# Patient Record
Sex: Male | Born: 1975
Health system: Southern US, Community
[De-identification: ages and names within clinical notes are randomized; demographics above are authoritative.]

## PROBLEM LIST (undated history)

## (undated) DIAGNOSIS — F410 Panic disorder [episodic paroxysmal anxiety] without agoraphobia: Secondary | ICD-10-CM

## (undated) DIAGNOSIS — J449 Chronic obstructive pulmonary disease, unspecified: Secondary | ICD-10-CM

## (undated) DIAGNOSIS — R51 Headache: Secondary | ICD-10-CM

## (undated) DIAGNOSIS — K219 Gastro-esophageal reflux disease without esophagitis: Secondary | ICD-10-CM

## (undated) DIAGNOSIS — I10 Essential (primary) hypertension: Secondary | ICD-10-CM

## (undated) HISTORY — DX: Gastro-esophageal reflux disease without esophagitis: K21.9

## (undated) HISTORY — DX: Chronic obstructive pulmonary disease, unspecified: J44.9

## (undated) HISTORY — DX: Headache: R51

## (undated) HISTORY — PX: GANGLION CYST EXCISION: SHX1691

---

## 2002-06-12 ENCOUNTER — Encounter (INDEPENDENT_AMBULATORY_CARE_PROVIDER_SITE_OTHER): Payer: Self-pay | Admitting: *Deleted

## 2002-06-12 ENCOUNTER — Ambulatory Visit (HOSPITAL_BASED_OUTPATIENT_CLINIC_OR_DEPARTMENT_OTHER): Admission: RE | Admit: 2002-06-12 | Discharge: 2002-06-12 | Payer: Self-pay | Admitting: Orthopedic Surgery

## 2002-12-20 ENCOUNTER — Emergency Department (HOSPITAL_COMMUNITY): Admission: EM | Admit: 2002-12-20 | Discharge: 2002-12-21 | Payer: Self-pay | Admitting: Emergency Medicine

## 2004-03-25 ENCOUNTER — Emergency Department (HOSPITAL_COMMUNITY): Admission: EM | Admit: 2004-03-25 | Discharge: 2004-03-25 | Payer: Self-pay | Admitting: Emergency Medicine

## 2009-02-22 ENCOUNTER — Emergency Department (HOSPITAL_COMMUNITY): Admission: EM | Admit: 2009-02-22 | Discharge: 2009-02-22 | Payer: Self-pay | Admitting: Emergency Medicine

## 2010-08-28 ENCOUNTER — Ambulatory Visit
Admission: RE | Admit: 2010-08-28 | Discharge: 2010-08-28 | Payer: Self-pay | Source: Home / Self Care | Attending: Family Medicine | Admitting: Family Medicine

## 2010-08-28 ENCOUNTER — Encounter: Payer: Self-pay | Admitting: Family Medicine

## 2010-08-28 DIAGNOSIS — J45909 Unspecified asthma, uncomplicated: Secondary | ICD-10-CM | POA: Insufficient documentation

## 2010-09-22 ENCOUNTER — Ambulatory Visit
Admission: RE | Admit: 2010-09-22 | Discharge: 2010-09-22 | Payer: Self-pay | Source: Home / Self Care | Attending: Emergency Medicine | Admitting: Emergency Medicine

## 2010-09-22 DIAGNOSIS — R519 Headache, unspecified: Secondary | ICD-10-CM | POA: Insufficient documentation

## 2010-09-22 DIAGNOSIS — R51 Headache: Secondary | ICD-10-CM | POA: Insufficient documentation

## 2010-09-22 DIAGNOSIS — K219 Gastro-esophageal reflux disease without esophagitis: Secondary | ICD-10-CM | POA: Insufficient documentation

## 2010-09-24 NOTE — Assessment & Plan Note (Signed)
Summary: shortness of breath//ph   Vital Signs:  Patient profile:   35 year old male Height:      72 inches Weight:      231 pounds BMI:     31.44 O2 Sat:      97 % on Room air Temp:     98.7 degrees F oral Pulse rate:   86 / minute BP sitting:   120 / 68  (left arm)  Vitals Entered By: Doristine Devoid CMA (August 28, 2010 11:02 AM)  O2 Flow:  Room air CC: intermittent SOB xmonths- hx of asthma as a child   History of Present Illness: 71 yo man here today to establish care.  previous MD- Parke Simmers, hasn't seen in over 8 yrs.  SOB- occurs w/ minimal exertion (walking up the stairs, extensive talking).  running w/ his kids is difficult.  sxs started a couple months ago.  was told at pre-employment CPE in 2008 that MD was worried about his ability to 'breathe out'.  not currently using any inhalers.  no CP.  pt admits to current seasonal allergies.  Preventive Screening-Counseling & Management  Alcohol-Tobacco     Smoking Status: never  Caffeine-Diet-Exercise     Does Patient Exercise: no      Sexual History:  currently monogamous.        Drug Use:  never.    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 No apparent alopecia or balding. Ears:  External ear exam shows no significant lesions  or deformities.  Otoscopic examination reveals clear canals, tympanic membranes are intact bilaterally without bulging, retraction, inflammation or discharge. Hearing is grossly normal bilaterally. Nose:  marked turbinate edema Mouth:  + PND, otherwise WNL Neck:  No deformities, masses, or tenderness noted. Lungs:  Normal respiratory effort, chest expands symmetrically. Lungs are clear to auscultation, no crackles or wheezes. Heart:  Normal rate and regular rhythm. S1 and S2 normal without gallop, murmur, click, rub or other extra sounds. Pulses:  +2 carotid, radial, DP   Impression & Recommendations:  Problem # 1:  ASTHMA (ICD-493.90) Assessment New  pt's sxs suspicious for asthma.  pt did PFTs- pre and post neb tx.  the pre-albuterol PFTs caused pt to get light headed.  pt felt the post albuterol PFTs were easier to complete.  lung age on pre-neb PFTs is >6 yo.  reviewed this w/ pt.  will start Qvar as controller med and albuterol as needed for SOB.  will refer for complete pulm evaluation and tx.  total time spent w/ pt, testing, and treating 43 minutes. His updated medication list for this problem includes:    Qvar 40 Mcg/act Aers (Beclomethasone dipropionate) .Marland Kitchen... 2 puffs two times a day to control symptoms    Proair Hfa 108 (90 Base) Mcg/act Aers (Albuterol sulfate) .Marland Kitchen... 2 puffs q4 as needed for wheezing or shortness of breath  Orders: Nebulizer Tx ([address])  Spirometry (Pre & Post) [phone]) HFA Instruction [phone]) Pulmonary Referral (Pulmonary)  Complete Medication List: 1)  Nasonex 50 Mcg/act Susp (Mometasone furoate) .... 2 sprays each nostril once daily 2)  Qvar 40 Mcg/act Aers (Beclomethasone dipropionate) .... 2 puffs two times a day to control symptoms 3)  Proair Hfa 108 (90 Base) Mcg/act Aers (Albuterol sulfate) .... 2 puffs q4 as needed for wheezing or shortness of breath  Patient Instructions: 1)  Please schedule a follow-up appointment in 6 weeks to follow up on your  asthma 2)  Start the Nasonex daily to decrease congestion and post nasal drip 3)  Use the Qvar EVERY DAY- this is your controller medicine 4)  Use the Proair AS NEEDED- this is the rescue medicine 5)  We'll call you with your pulmonary appt 6)  Call with any questions or concerns 7)  Welcome!  We're glad to have you!  Prescriptions: PROAIR HFA 108 (90 BASE) MCG/ACT AERS (ALBUTEROL SULFATE) 2 puffs Q4 as needed for wheezing or shortness of breath  #1 x 3   Entered and Authorized by:   Neena Rhymes MD   Signed by:   Neena Rhymes MD on 08/28/2010   Method used:   [facility] to        [facility] [id number]       [address]       [place], [place]  [address]       Ph: [phone] or [phone]       Fax: [phone]   RxID:   [id number] QVAR 40 MCG/ACT AERS (BECLOMETHASONE DIPROPIONATE) 2 puffs two times a day to control symptoms  #1 x 3   Entered and Authorized by:   Neena Rhymes MD   Signed by:   Neena Rhymes MD on 08/28/2010   Method used:   [facility]        [id number]       [address]       [place], [place]  [address]       Ph: [phone] or [phone]       Fax: [phone]   RxID:   [id number] NASONEX 50 MCG/ACT SUSP (MOMETASONE FUROATE) 2 sprays each nostril once daily  #1 x 3   Entered and Authorized by:   Neena Rhymes MD   Signed by:   Neena Rhymes MD on 08/28/2010   Method used:   [facility]        [facility] [id number]       [address]       [place], [place]  [address]       Ph: [phone] or [phone]       Fax: [phone]   RxID:   [id number]    Orders Added: 1)  New Patient Level III [99203] 2)  Nebulizer Tx [27782] 3)  Spirometry (Pre & Post) [94060] 4)  HFA Instruction [42353] 5)  Pulmonary Referral [Pulmonary]

## 2010-09-30 NOTE — Assessment & Plan Note (Signed)
Summary: asthma, GERD   Visit Type:  Initial Consult Copy to:  Dr. Neena Rhymes Primary Provider/Referring Provider:  Dr. Neena Rhymes  CC:  Pulmonary consult.  ? exercise induced asthma..  History of Present Illness: 35 yo m, smoker, hx GERD, childhood asthma as a small child, but did not require BD' s in grade school or beyond. 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   Noticed some cough and residual SOB after exercise. Now with SOB with climbing stairs, playing with his children. Has GERD symptoms more bothersome for several months, sour reflux into mouth. Hears wheezing with exertion. Using ProAir as needed, 2 -3 x a day. Started QVAR 1/12 by Dr Beverely Low.   Preventive Screening-Counseling & Management  Alcohol-Tobacco     Alcohol drinks/day: 2     Alcohol type: beer     Smoking Status: quit     Packs/Day: 1.0     Year Started: 1994     Year Quit: 2002     Pack years: 8  Current Medications (verified): 1)  Proair Hfa 108 (90 Base) Mcg/act Aers (Albuterol Sulfate) .... 2 Puffs Q4 As Needed For Wheezing or Shortness of Breath 2)  Alka-Seltzer Extra Strength 500-1-1.985 Mg-Gm-Gm Tbef (Aspirin Effervescent) .... Once Daily  Allergies (verified): No Known Drug Allergies  Past History:  Past Medical History: Current Problems:  G E R D (ICD-530.81) HEADACHE, CHRONIC (ICD-784.0) ASTHMA (ICD-493.90)  Past Surgical History: L cyst removal from wrist in 2004  Family History: CAD-no HTN-mother,father DM-father, PGF STROKE-no COLON CA-no PROSTATE CA-no  Social History: Alcohol drinks/day:  2 Smoking Status:  quit Packs/Day:  1.0 Pack years:  8  Review of Systems       The patient complains of shortness of breath with activity, shortness of breath at rest, non-productive cough, acid heartburn, indigestion, weight change, and anxiety.  The patient denies productive cough, coughing up blood, chest pain, irregular heartbeats, loss of  appetite, abdominal pain, difficulty swallowing, sore throat, tooth/dental problems, headaches, nasal congestion/difficulty breathing through nose, sneezing, itching, ear ache, depression, hand/feet swelling, joint stiffness or pain, rash, change in color of mucus, and fever.    Vital Signs:  Patient profile:   35 year old male Height:      72 inches (182.88 cm) Weight:      243.13 pounds (110.51 kg) BMI:     33.09 O2 Sat:      98 % on Room air Temp:     98.1 degrees F (36.72 degrees C) oral Pulse rate:   65 / minute BP sitting:   136 / 90  (right arm) Cuff size:   large  Vitals Entered By: Michel Bickers CMA (September 22, 2010 11:24 AM)  O2 Sat at Rest %:  98 O2 Flow:  Room air CC: Pulmonary consult.  ? exercise induced asthma. Is Patient Diabetic? No Comments Medications reviewed with patient Michel Bickers CMA  September 22, 2010 11:39 AM   Physical Exam  General:  Well-developed,well-nourished,in no acute distress; alert,appropriate and cooperative throughout examination Head:  normocephalic and atraumatic Eyes:  conjunctiva and sclera clear Nose:  no deformity, discharge, inflammation, or lesions Mouth:  no deformity or lesions Neck:  No deformities, masses, or tenderness noted. Lungs:  Normal respiratory effort, chest expands symmetrically. Lungs are clear to auscultation, no crackles or wheezes. Heart:  Normal rate and regular rhythm. S1 and S2 normal without gallop, murmur, click, rub or other extra sounds. Extremities:  no clubbing, cyanosis, edema, or  deformity noted Psych:  alert and cooperative; normal mood and affect; normal attention span and concentration   Pulmonary Function Test Date: 08/28/2010 Height (in.): 72 Gender: Male  Pre-Spirometry FVC    Value: 3.68 L/min   Pred: 4.74 L/min     % Pred: 78 % FEV1    Value: 1.81 L     Pred: 3.85 L     % Pred: 47 % FEV1/FVC  Value: 49 %     Pred: 81 %     % Pred: 60 % FEF 25-75  Value: 0.82 L/min   Pred: 4.53 L/min     %  Pred: 18 %  Post-Spirometry FVC    Value: 4.52 L/min   Pred: 4.74 L/min     % Pred: 95 % FEV1    Value: 2.11 L     Pred: 3.85 L     % Pred: 55 % FEV1/FVC  Value: 47 %     Pred: 81 %     % Pred: 57 % FEF 25-75  Value: 0.88 L/min   Pred: 4.53 L/min     % Pred: 19 %  Comments: Moderately severe AFL with positive BD response. RSB  Impression & Recommendations:  Problem # 1:  ASTHMA (ICD-493.90) Dx confirmed by spiro 1/6 by Dr Beverely Low. Control really no better on the QVAR, still using freq SABA. May be due to uncontrolled GERD.  - will try to control GERD, see if this allows better control on QVAR + albuterol. If so, will continue this regimen. If not, then may need to change to LABA/ICS combo - if control is excellent after GERD control, could consider peeling of the QVAR at some point.  - agree with Dr Beverely Low, he may need scheduled meds for allergies, esp during spring/fall  Problem # 2:  G E R D (ICD-530.81) - start omeprazole once daily - discussed GERD diet and not eating before bedtime, etc  Medications Added to Medication List This Visit: 1)  Alka-seltzer Extra Strength 500-1-1.985 Mg-gm-gm Tbef (Aspirin effervescent) .... Once daily 2)  Omeprazole 20 Mg Cpdr (Omeprazole) .Marland Kitchen.. 1 by mouth once daily  Other Orders: Consultation Level IV (04540)  Patient Instructions: 1)  Please continue your QVAR , 2 puffs two times a day  2)  Use ProAir as needed  3)  Start omeprazole 20mg  by mouth once daily  4)  Try to avoid meals within 2 hours of bedtime.  5)  Follow up with Dr Delton Coombes in 1 month to assess your progress.  Prescriptions: OMEPRAZOLE 20 MG CPDR (OMEPRAZOLE) 1 by mouth once daily  #30 x 3   Entered and Authorized by:   Leslye Peer MD   Signed by:   Leslye Peer MD on 09/22/2010   Method used:   [facility] [id number]       [address]       [place], [place]  [address]       Ph: [phone] or [phone]        Fax: [phone]   RxID:   [id number]

## 2010-10-03 ENCOUNTER — Encounter: Payer: Self-pay | Admitting: Family Medicine

## 2010-10-09 ENCOUNTER — Ambulatory Visit: Payer: Self-pay | Admitting: Family Medicine

## 2010-10-12 ENCOUNTER — Ambulatory Visit: Payer: Self-pay | Admitting: Family Medicine

## 2010-10-14 ENCOUNTER — Encounter: Payer: Self-pay | Admitting: Family Medicine

## 2010-10-14 ENCOUNTER — Ambulatory Visit (INDEPENDENT_AMBULATORY_CARE_PROVIDER_SITE_OTHER): Payer: BC Managed Care – PPO | Admitting: Family Medicine

## 2010-10-14 DIAGNOSIS — J45909 Unspecified asthma, uncomplicated: Secondary | ICD-10-CM

## 2010-10-14 DIAGNOSIS — J309 Allergic rhinitis, unspecified: Secondary | ICD-10-CM

## 2010-10-14 DIAGNOSIS — J069 Acute upper respiratory infection, unspecified: Secondary | ICD-10-CM | POA: Insufficient documentation

## 2010-10-20 NOTE — Assessment & Plan Note (Signed)
Summary: 6 WKS ROV/PH--resch from Fri 2/17, pt resch/nta   Vital Signs:  Patient profile:   35 year old male Weight:      233 pounds BMI:     31.71 Pulse rate:   70 / minute BP sitting:   120 / 80  (left arm)  Vitals Entered By: Doristine Devoid CMA (October 14, 2010 4:20 PM) CC: f/u on asthma   History of Present Illness: 35 yo man here today for asthma f/u.  has started taking Omeprazole for GERD, continuing the QVAR.  using Albuterol inhaler as needed.  was doing well until recent URI.  again having SOB.  has lost 10 lbs.  overall feeling well.  had got to the point of rarely using recue inhaler.  kids recently sick, pt developed dry cough, chills, sxs started 5 days ago.  denies ear pain, fever, sore throat.  used nasonex samples- has run out of meds.  Current Medications (verified): 1)  Proair Hfa 108 (90 Base) Mcg/act Aers (Albuterol Sulfate) .... 2 Puffs Q4 As Needed For Wheezing or Shortness of Breath 2)  Alka-Seltzer Extra Strength 500-1-1.985 Mg-Gm-Gm Tbef (Aspirin Effervescent) .... Once Daily 3)  Omeprazole 20 Mg Cpdr (Omeprazole) .Marland Kitchen.. 1 By Mouth Once Daily 4)  Qvar 40 Mcg/act Aers (Beclomethasone Dipropionate) .... Take 2 Puffs Two Times A Day 5)  Nasonex 50 Mcg/act Susp (Mometasone Furoate) .... 2 Sprays Each Nostril Once Daily  Allergies (verified): No Known Drug Allergies  Past History:  Past medical, surgical, family and social histories (including risk factors) reviewed, and no changes noted (except as noted below).  Past Medical History: Current Problems:  G E R D (ICD-530.81) HEADACHE, CHRONIC (ICD-784.0) ASTHMA (ICD-493.90) rhinitis  Past Surgical History: Reviewed history from 09/22/2010 and no changes required. L cyst removal from wrist in 2004  Family History: Reviewed history from 09/22/2010 and no changes required. CAD-no HTN-mother,father DM-father, PGF STROKE-no COLON CA-no PROSTATE CA-no  Social History: Reviewed history from 08/28/2010  and no changes required. 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 No apparent alopecia or balding.  no TTP over sinuses Eyes:  no injxn or inflammation Ears:  External ear exam shows no significant lesions or deformities.  Otoscopic examination reveals clear canals, tympanic membranes are intact bilaterally without bulging, retraction, inflammation or discharge. Hearing is grossly normal bilaterally. Nose:  marked turbinate edema Mouth:  + PND, otherwise WNL Neck:  No deformities, masses, or tenderness noted. Lungs:  Normal respiratory effort, chest expands symmetrically. Lungs are clear to auscultation, no crackles or wheezes. Heart:  Normal rate and regular rhythm. S1 and S2 normal without gallop, murmur, click, rub or other extra sounds.   Impression & Recommendations:  Problem # 1:  ASTHMA (ICD-493.90) Assessment Unchanged improved since starting meds.  had almost stopped albuterol use completely. His updated medication list for this problem includes:    Proair Hfa 108 (90 Base) Mcg/act Aers (Albuterol sulfate) .Marland Kitchen... 2 puffs q4 as needed for wheezing or shortness of breath    Qvar 40 Mcg/act Aers (Beclomethasone dipropionate) .Marland Kitchen... Take 2 puffs two times a day  Problem # 2:  RHINITIS (ICD-477.9) Assessment: New script given for nasonex as pt feels this was helpful.  add OTC antihistamine His updated medication list for this problem includes:  Nasonex 50 Mcg/act Susp (Mometasone furoate) .Marland Kitchen... 2 sprays each nostril once daily  Problem # 3:  URI (ICD-465.9) Assessment: New sxs appear viral.  no evidence of bacterial infxn on exam.  reviewed supportive care and red flags that should prompt return.  Pt expresses  understanding and is in agreement w/ this plan. His updated medication list for this problem includes:    Alka-seltzer Extra Strength 500-1-1.985 Mg-gm-gm Tbef (Aspirin effervescent) ..... Once daily  Complete Medication List: 1)  Proair Hfa 108 (90 Base) Mcg/act Aers (Albuterol sulfate) .... 2 puffs q4 as needed for wheezing or shortness of breath 2)  Alka-seltzer Extra Strength 500-1-1.985 Mg-gm-gm Tbef (Aspirin effervescent) .... Once daily 3)  Omeprazole 20 Mg Cpdr (Omeprazole) .Marland Kitchen.. 1 by mouth once daily 4)  Qvar 40 Mcg/act Aers (Beclomethasone dipropionate) .... Take 2 puffs two times a day 5)  Nasonex 50 Mcg/act Susp (Mometasone furoate) .... 2 sprays each nostril once daily  Patient Instructions: 1)  Add Claritin or Zyrtec daily for allergy component 2)  Continue the Nasonex for nasal allergies and post nasal drip 3)  Ibuprofen for body aches and/or fever 4)  Continue the Qvar, Omeprazole as directed 5)  Hang in there!!! Prescriptions: QVAR 40 MCG/ACT AERS (BECLOMETHASONE DIPROPIONATE) take 2 puffs two times a day  #1 x 3   Entered and Authorized by:   Neena Rhymes MD   Signed by:   Neena Rhymes MD on 10/14/2010   Method used:   [facility] to        [id number]       [address]       [place], [place]  [address]       Ph: [phone] or [phone]       Fax: [phone]   RxID:   [id number] PROAIR HFA 108 (90 BASE) MCG/ACT AERS (ALBUTEROL SULFATE) 2 puffs Q4 as needed for wheezing or shortness of breath  #1 x 3   Entered and Authorized by:   Neena Rhymes MD   Signed by:   Neena Rhymes MD on 10/14/2010   Method used:   [facility]        [facility]  [id number]       [address]       [place], [place]  [address]       Ph: [phone] or [phone]       Fax: [phone]   RxID:   [id number] NASONEX 50 MCG/ACT SUSP (MOMETASONE FUROATE) 2 sprays each nostril  once daily  #1 x 3   Entered and Authorized by:   Neena Rhymes MD   Signed by:   Neena Rhymes MD on 10/14/2010   Method used:   [facility]        [facility]  [address] [id number]       [address]       [place], [place]  [address]       Ph: [phone] or [phone]       Fax: [phone]   RxID:   [id number]    Orders Added: 1)  Est. Patient Level IV [09735]

## 2010-10-21 ENCOUNTER — Ambulatory Visit: Payer: Self-pay | Admitting: Emergency Medicine

## 2010-10-22 ENCOUNTER — Telehealth: Payer: Self-pay | Admitting: Emergency Medicine

## 2010-10-29 NOTE — Progress Notes (Signed)
Summary: nos appt  Phone Note Call from Patient   Caller: [email or web address]  Call For: Michaelina Blandino Summary of Call: LMTCB x2 to rsc nos from 2/29. Initial call taken by: Darletta Moll,  October 22, 2010 3:17 PM

## 2011-01-08 NOTE — Op Note (Signed)
NAME:  Richard Hanna, Richard Hanna                          ACCOUNT NO.:  [id number]   MEDICAL RECORD NO.:  [id number]                   PATIENT TYPE:  AMB   LOCATION:  [facility]                                  FACILITY:  [facility]   PHYSICIAN:  Katy Fitch. Naaman Plummer., M.D.          DATE OF BIRTH:  05-Oct-1975   DATE OF PROCEDURE:  06/12/2002  DATE OF DISCHARGE:                                 OPERATIVE REPORT   PREOPERATIVE DIAGNOSIS:  Enlarging mass, dorsal aspect of the left wrist,  consistent with dorsal capsular ganglion.   POSTOPERATIVE DIAGNOSIS:  Enlarging mass, dorsal aspect of the left wrist,  consistent with dorsal capsular ganglion.   OPERATION:  Excision of a complex dorsal capsular ganglion, left wrist.   SURGEON:  Katy Fitch. Sypher, M.D.   ASSISTANT:  Marveen Reeks. Dasnoit, P. A.   ANESTHESIA:  General by LMA.   SUPERVISING ANESTHESIOLOGIST:  Janetta Hora. Gelene Mink, M.D.   INDICATIONS FOR SURGERY:  The patient is a 35 year old who presented for  evaluation and management of an uncomfortable mass on the dorsal aspect of  his left wrist.  Clinical examination suggested an enlarging ganglion.  Plain films did not document an interosseous component.  We recommended  proceeding with an excisional biopsy under regional anesthesia with  exploration of scapholunate interosseous ligament.   DESCRIPTION OF OPERATION:  The patient was brought to the operating room and  placed in the supine position on the operating table.  Following induction  of general anesthesia by LMA the left arm was prepped with Betadine soap and  solution, and sterilely draped.  Following exsanguination of the limb with  an Esmarch bandage an arterial tourniquet on the proximal brachium was  inflated to 220 mmHg.  Procedure commenced with a transverse incision  directly over the mass.  Subcutaneous tissues were carefully divided  revealing a large ganglion type lesion expanding the extensor retinaculum.  The proximal fibers of  the retinaculum were dissected off the mass and a few  filmy fibers of the retinaculum were taken with the mass distally.  The mass  was circumferentially dissected and involved a mucinous myxoma type  expansion of the dorsal capsule.  This was directly involving the dorsal  carpal arch.  The transverse dorsal carpal artery was dissected off the mass  and several feeding vessels were intimately involved with the mass, and were  resected with it.  Electrocautery was used to control the small arterial  bleeders.  Care was taken to preserve the posterior interosseous nerve.  The  cyst was circumferentially dissected down to the wrist capsule.  It appeared  to be exiting just distal to the scapholunate interosseous ligament.  A  radiocarpal arthrotomy was accomplished revealing an intact scapholunate  interosseous ligament.  The mid carpal joint did not appear to be involved.  The dorsal surface of the scapholunate ligament was cleaned with a rongeur.  Bleeding points were electrocauterized with  bipolar current followed by  repair of the capsule with a simple suture of 4-0 Vicryl followed by repair  of the skin with intradermal 3-0 Prolene.   There were no apparent complications.   The patient tolerated the surgery and anesthesia well.  He was transferred  to the recovery room with stable vital signs.                                                 Katy Fitch Naaman Plummer., M.D.    RVS/MEDQ  D:  06/12/2002  T:  06/12/2002  Job:  [id number]

## 2011-10-28 ENCOUNTER — Encounter: Payer: Self-pay | Admitting: Emergency Medicine

## 2011-10-28 ENCOUNTER — Ambulatory Visit (INDEPENDENT_AMBULATORY_CARE_PROVIDER_SITE_OTHER): Payer: BC Managed Care – PPO | Admitting: Emergency Medicine

## 2011-10-28 VITALS — BP 120/74 | HR 75 | Temp 98.1°F | Ht 70.0 in | Wt 220.0 lb

## 2011-10-28 DIAGNOSIS — J45909 Unspecified asthma, uncomplicated: Secondary | ICD-10-CM

## 2011-10-28 NOTE — Assessment & Plan Note (Signed)
Not clear whether this is his asthma or not - some of the sx are inconsistent with his prior episodes. Could reflect some deconditioning. Will try to treat asthma and see if he improves; if no real change then I would favor restarting exercise regimen (with SABA available in case of exercise-related sx).  - restart QVAR 80, 1 puff bid + Ventolin prn.  - rov in 6 weeks to reassess

## 2011-10-28 NOTE — Progress Notes (Signed)
36 yo m, smoker, hx GERD, childhood asthma as a small child, but did not require BD' s in grade school or beyond. 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   Noticed some cough and residual SOB after exercise. Now with SOB with climbing stairs, playing with his children. Has GERD symptoms more bothersome for several months, sour reflux into mouth. Hears wheezing with exertion. Using ProAir as needed, 2 -3 x a day. Started QVAR 1/12 by Dr Beverely Low.   ROV 10/28/11 -- Asthma with moderate control, felt to be made difficult by GERD. We treated with omeprazole, used for a few weeks but not since. GERD sx seem to be better. Stopped QVAR about a year ago and rarely needed SABA until last few months. He coughs with strenuous work. Now having episodes of DOE, sometimes at rest. Hasn't tried SABA yet with these episode   Gen: Pleasant, well-nourished, in no distress,  normal affect  ENT: No lesions,  mouth clear,  oropharynx clear, no postnasal drip  Neck: No JVD, no TMG, no carotid bruits  Lungs: No use of accessory muscles, no dullness to percussion, clear without rales or rhonchi  Cardiovascular: RRR, heart sounds normal, no murmur or gallops, no peripheral edema  Musculoskeletal: No deformities, no cyanosis or clubbing  Neuro: alert, non focal  Skin: Warm, no lesions or rashes  ASTHMA Not clear whether this is his asthma or not - some of the sx are inconsistent with his prior episodes. Could reflect some deconditioning. Will try to treat asthma and see if he improves; if no real change then I would favor restarting exercise regimen (with SABA available in case of exercise-related sx).  - restart QVAR 80, 1 puff bid + Ventolin prn.  - rov in 6 weeks to reassess

## 2011-10-28 NOTE — Patient Instructions (Signed)
Please restart OVAR , 1 puff twice a day Use Ventolin 2 puffs if needed for shortness of breath Keep track of your symptoms and follow up with Dr Delton Coombes in 6 weeks to review your status

## 2011-11-01 ENCOUNTER — Ambulatory Visit: Payer: BC Managed Care – PPO | Admitting: Emergency Medicine

## 2011-12-13 ENCOUNTER — Ambulatory Visit: Payer: BC Managed Care – PPO | Admitting: Emergency Medicine

## 2012-01-04 ENCOUNTER — Ambulatory Visit (INDEPENDENT_AMBULATORY_CARE_PROVIDER_SITE_OTHER): Payer: BC Managed Care – PPO | Admitting: Emergency Medicine

## 2012-01-04 ENCOUNTER — Encounter: Payer: Self-pay | Admitting: Emergency Medicine

## 2012-01-04 VITALS — BP 116/70 | HR 79 | Temp 98.3°F | Ht 70.0 in | Wt 219.0 lb

## 2012-01-04 DIAGNOSIS — J309 Allergic rhinitis, unspecified: Secondary | ICD-10-CM

## 2012-01-04 DIAGNOSIS — J45909 Unspecified asthma, uncomplicated: Secondary | ICD-10-CM

## 2012-01-04 MED ORDER — LORATADINE 10 MG PO TABS: 10.0000 mg | ORAL_TABLET | Freq: Every day | ORAL | Status: DC

## 2012-01-04 NOTE — Progress Notes (Signed)
36 yo m, smoker, hx GERD, childhood asthma as a small child, but did not require BD' s in grade school or beyond. 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   Noticed some cough and residual SOB after exercise. Now with SOB with climbing stairs, playing with his children. Has GERD symptoms more bothersome for several months, sour reflux into mouth. Hears wheezing with exertion. Using ProAir as needed, 2 -3 x a day. Started QVAR 1/12 by Dr Beverely Low.   ROV 10/28/11 -- Asthma with moderate control, felt to be made difficult by GERD. We treated with omeprazole, used for a few weeks but not since. GERD sx seem to be better. Stopped QVAR about a year ago and rarely needed SABA until last few months. He coughs with strenuous work. Now having episodes of DOE, sometimes at rest. Hasn't tried SABA yet with these episode   ROV 01/04/12 -- 35 yo man, hx asthma, was experiencing DOE, somewhat different from prior asthma sx. We restarted QVAR to se if it would have an effect. Hasn't restarted omeprazole, no GERD sx. His cough is better than last visit. He hasn't had to use SABA since going back on the QVAR.    Filed Vitals:   01/04/12 1634  BP: 116/70  Pulse: 79  Temp: 98.3 F (36.8 C)   Gen: Pleasant, well-nourished, in no distress,  normal affect  ENT: No lesions,  mouth clear,  oropharynx clear, no postnasal drip  Neck: No JVD, no TMG, no carotid bruits  Lungs: No use of accessory muscles, no dullness to percussion, clear without rales or rhonchi  Cardiovascular: RRR, heart sounds normal, no murmur or gallops, no peripheral edema  Musculoskeletal: No deformities, no cyanosis or clubbing  Neuro: alert, non focal  Skin: Warm, no lesions or rashes  ASTHMA Seems to be benefiting from the QVAR, could reconsider stopping at some point in the future, especially if we get the allergies under control - continue QVAR - SABA prn - rov 6 mon  RHINITIS - start loratadine qd

## 2012-01-04 NOTE — Assessment & Plan Note (Signed)
-   start loratadine qd 

## 2012-01-04 NOTE — Assessment & Plan Note (Addendum)
Seems to be benefiting from the QVAR, could reconsider stopping at some point in the future, especially if we get the allergies under control - continue QVAR - SABA prn - rov 6 mon

## 2012-01-04 NOTE — Patient Instructions (Signed)
Please continue your QVAR twice a day Use your Ventolin as needed Start taking loratadine daily for your allergies Follow with Dr Delton Coombes in 6 months or sooner if you have any problems

## 2012-06-12 ENCOUNTER — Ambulatory Visit (INDEPENDENT_AMBULATORY_CARE_PROVIDER_SITE_OTHER)
Admission: RE | Admit: 2012-06-12 | Discharge: 2012-06-12 | Disposition: A | Discharge status: Home / Self Care | Payer: BC Managed Care – PPO | Source: Ambulatory Visit | Attending: Emergency Medicine | Admitting: Emergency Medicine

## 2012-06-12 ENCOUNTER — Ambulatory Visit (INDEPENDENT_AMBULATORY_CARE_PROVIDER_SITE_OTHER): Payer: BC Managed Care – PPO | Admitting: Emergency Medicine

## 2012-06-12 ENCOUNTER — Encounter: Payer: Self-pay | Admitting: Emergency Medicine

## 2012-06-12 VITALS — BP 122/80 | HR 68 | Temp 98.7°F | Ht 70.0 in | Wt 213.8 lb

## 2012-06-12 DIAGNOSIS — J45909 Unspecified asthma, uncomplicated: Secondary | ICD-10-CM

## 2012-06-12 NOTE — Assessment & Plan Note (Signed)
Unclear to me that his sx relate to asthma. Trigger seems to be exercise.  - albuterol prn - do not restart QVAR for now - CXR  - EIB + full PFT - rov 1 month

## 2012-06-12 NOTE — Progress Notes (Signed)
36 yo m, smoker, hx GERD, childhood asthma as a small child, but did not require BD' s in grade school or beyond. 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   Noticed some cough and residual SOB after exercise. Now with SOB with climbing stairs, playing with his children. Has GERD symptoms more bothersome for several months, sour reflux into mouth. Hears wheezing with exertion. Using ProAir as needed, 2 -3 x a day. Started QVAR 1/12 by Dr Beverely Low.   ROV 10/28/11 -- Asthma with moderate control, felt to be made difficult by GERD. We treated with omeprazole, used for a few weeks but not since. GERD sx seem to be better. Stopped QVAR about a year ago and rarely needed SABA until last few months. He coughs with strenuous work. Now having episodes of DOE, sometimes at rest. Hasn't tried SABA yet with these episode   ROV 01/04/12 -- hx asthma, was experiencing DOE, somewhat different from prior asthma sx. We restarted QVAR to see if it would have an effect. Hasn't restarted omeprazole, no GERD sx. His cough is better than last visit. He hasn't had to use SABA since going back on the QVAR.   ROV 06/12/12 -- asthma, moderate control. We had continued QVAR - he ran out about 2 months ago. Having some new URI sx for the last 4-5 days, has made him more SOB with exertion. Stopped QVAR in June - wasn't sure he missed it. Ran out of albuterol -    Filed Vitals:   06/12/12 1514  BP: 122/80  Pulse: 68  Temp: 98.7 F (37.1 C)   Gen: Pleasant, well-nourished, in no distress,  normal affect  ENT: No lesions,  mouth clear,  oropharynx clear, no postnasal drip  Neck: No JVD, no TMG, no carotid bruits  Lungs: No use of accessory muscles, no dullness to percussion, clear without rales or rhonchi  Cardiovascular: RRR, heart sounds normal, no murmur or gallops, no peripheral edema  Musculoskeletal: No deformities, no cyanosis or clubbing  Neuro: alert, non focal  Skin: Warm, no lesions or  rashes  ASTHMA Unclear to me that his sx relate to asthma. Trigger seems to be exercise.  - albuterol prn - do not restart QVAR for now - CXR  - EIB + full PFT - rov 1 month

## 2012-06-12 NOTE — Patient Instructions (Addendum)
CXR today Do not restart QVAR for now Use your albuterol 2 puffs as needed We will perform full pulmonary function testing We will perform an Exercise Induced Bronchospasm test (excercise spirometry) Follow with Dr Delton Coombes in 1 month

## 2012-06-14 NOTE — Progress Notes (Signed)
Quick Note:  ATC patient to inform of results, no answer. LMOMTCB ______

## 2012-06-27 ENCOUNTER — Ambulatory Visit (HOSPITAL_COMMUNITY): Payer: BC Managed Care – PPO

## 2012-07-10 ENCOUNTER — Ambulatory Visit (HOSPITAL_COMMUNITY): Payer: BC Managed Care – PPO | Attending: Emergency Medicine

## 2012-07-13 ENCOUNTER — Ambulatory Visit: Payer: BC Managed Care – PPO | Admitting: Emergency Medicine

## 2012-09-26 ENCOUNTER — Encounter: Payer: Self-pay | Admitting: Emergency Medicine

## 2012-09-26 ENCOUNTER — Ambulatory Visit (INDEPENDENT_AMBULATORY_CARE_PROVIDER_SITE_OTHER): Payer: BC Managed Care – PPO | Admitting: Emergency Medicine

## 2012-09-26 VITALS — BP 132/73 | HR 82 | Temp 98.0°F | Resp 16 | Ht 70.0 in | Wt 214.0 lb

## 2012-09-26 DIAGNOSIS — R079 Chest pain, unspecified: Secondary | ICD-10-CM

## 2012-09-26 DIAGNOSIS — F411 Generalized anxiety disorder: Secondary | ICD-10-CM

## 2012-09-26 DIAGNOSIS — F410 Panic disorder [episodic paroxysmal anxiety] without agoraphobia: Secondary | ICD-10-CM

## 2012-09-26 MED ORDER — PAROXETINE HCL 20 MG PO TABS: 20.0000 mg | ORAL_TABLET | ORAL | Status: DC

## 2012-09-26 MED ORDER — ALBUTEROL SULFATE HFA 108 (90 BASE) MCG/ACT IN AERS: 2.0000 | INHALATION_SPRAY | RESPIRATORY_TRACT | Status: DC | PRN

## 2012-09-26 MED ORDER — LORAZEPAM 1 MG PO TABS: 1.0000 mg | ORAL_TABLET | Freq: Three times a day (TID) | ORAL | Status: DC | PRN

## 2012-09-26 NOTE — Patient Instructions (Addendum)

## 2012-09-26 NOTE — Progress Notes (Signed)
[facility] [address] [place] [address] [phone]  Date:  09/26/2012   Name:  Richard Hanna   DOB:  10/14/1975   MRN:  [id number]  PCP:  Neena Rhymes, MD    Chief Complaint: Chest Pain and Medication Refill   History of Present Illness:  Richard Hanna is a 37 y.o. very pleasant male patient who presents with the following:  Has frequent recent episodes of sensation of chest pain and shortness of breath.  Not associated with asthma or wheezing.  Sometimes associated with tingling in hands and around mouth.  Today had episode of shortness of breath associated with numbness in fingers in left hand and chest discomfort that lasted about a half hour.  No associated palpitations or tachyarrhythmia or nausea or diaphoresis.  Symptoms largely now resolved.  Admits to frequent anxiety and trouble feeling like things are closing in on him.  Patient Active Problem List  Diagnosis  . ASTHMA  . G E R D  . HEADACHE, CHRONIC  . URI  . RHINITIS    Past Medical History  Diagnosis Date  . GERD (gastroesophageal reflux disease)   . Asthma   . Headache     Past Surgical History  Procedure Date  . Ganglion cyst excision     L wrist    History  Substance Use Topics  . Smoking status: Former Smoker -- 1.0 packs/day for 6 years    Types: Cigarettes    Quit date: 08/23/2002  . Smokeless tobacco: Never Used  . Alcohol Use: Yes    Family History  Problem Relation Age of Onset  . Hypertension Mother   . Hypertension Father   . Diabetes Father   . Diabetes Paternal Grandfather     No Known Allergies  Medication list has been reviewed and updated.  Current Outpatient Prescriptions on File Prior to Visit  Medication Sig Dispense Refill  . albuterol (PROAIR HFA) 108 (90 BASE) MCG/ACT inhaler Inhale 2 puffs into the lungs every 4 (four) hours as needed. For wheezing or shortness of breath       . beclomethasone (QVAR) 80 MCG/ACT inhaler Inhale 1  puff into the lungs 2 (two) times daily.      Marland Kitchen loratadine (CLARITIN) 10 MG tablet Take 1 tablet (10 mg total) by mouth daily.  30 tablet  11  . Omeprazole 20 MG TBEC Take by mouth. Take one tablet daily as needed      . PARoxetine (PAXIL) 20 MG tablet Take 1 tablet (20 mg total) by mouth every morning.  30 tablet  5    Review of Systems:  As per HPI, otherwise negative.    Physical Examination: Filed Vitals:   09/26/12 1523  BP: 132/73  Pulse: 82  Temp: 98 F (36.7 C)  Resp: 16   Filed Vitals:   09/26/12 1523  Height: 5\' 10"  (1.778 m)  Weight: 214 lb (97.07 kg)   Body mass index is 30.71 kg/(m^2). Ideal Body Weight: Weight in (lb) to have BMI = 25: 173.9   GEN: WDWN, NAD, Non-toxic, A & O x 3 HEENT: Atraumatic, Normocephalic. Neck supple. No masses, No LAD. Ears and Nose: No external deformity. CV: RRR, No M/G/R. No JVD. No thrill. No extra heart sounds. PULM: CTA B, no wheezes, crackles, rhonchi. No retractions. No resp. distress. No accessory muscle use. ABD: S, NT, ND, +BS. No rebound. No HSM. EXTR: No c/c/e NEURO Normal gait.  PSYCH: Normally interactive.  Conversant. Not depressed or anxious appearing.  Calm demeanor.    Assessment and Plan: Anxiety disorder Chest pain Ativan paxil Follow up in one month Offered ER evaluation and refused.  Carmelina Dane, MD

## 2013-03-06 ENCOUNTER — Ambulatory Visit (INDEPENDENT_AMBULATORY_CARE_PROVIDER_SITE_OTHER): Payer: BC Managed Care – PPO | Admitting: Family Medicine

## 2013-03-06 ENCOUNTER — Encounter: Payer: Self-pay | Admitting: Family Medicine

## 2013-03-06 VITALS — BP 110/60 | HR 68 | Temp 98.8°F | Ht 71.5 in | Wt 210.6 lb

## 2013-03-06 DIAGNOSIS — J45909 Unspecified asthma, uncomplicated: Secondary | ICD-10-CM

## 2013-03-06 DIAGNOSIS — F411 Generalized anxiety disorder: Secondary | ICD-10-CM

## 2013-03-06 LAB — TSH: TSH: 2.07 u[IU]/mL (ref 0.35–5.50)

## 2013-03-06 LAB — BASIC METABOLIC PANEL
BUN: 13 mg/dL (ref 6–23)
Chloride: 103 mEq/L (ref 96–112)
Creatinine, Ser: 1.6 mg/dL — ABNORMAL HIGH (ref 0.4–1.5)
Glucose, Bld: 93 mg/dL (ref 70–99)
Potassium: 4.2 mEq/L (ref 3.5–5.1)

## 2013-03-06 LAB — CBC WITH DIFFERENTIAL/PLATELET
Basophils Absolute: 0 10*3/uL (ref 0.0–0.1)
Basophils Relative: 0.5 % (ref 0.0–3.0)
Eosinophils Absolute: 0.1 10*3/uL (ref 0.0–0.7)
Lymphocytes Relative: 20.3 % (ref 12.0–46.0)
Lymphs Abs: 1.3 10*3/uL (ref 0.7–4.0)
MCV: 92.1 fl (ref 78.0–100.0)
Monocytes Absolute: 0.3 10*3/uL (ref 0.1–1.0)
Neutro Abs: 4.7 10*3/uL (ref 1.4–7.7)
Platelets: 204 10*3/uL (ref 150.0–400.0)
RDW: 13.4 % (ref 11.5–14.6)

## 2013-03-06 MED ORDER — FLUOXETINE HCL 20 MG PO TABS: 20.0000 mg | ORAL_TABLET | Freq: Every day | ORAL | Status: DC

## 2013-03-06 MED ORDER — LORAZEPAM 1 MG PO TABS: 1.0000 mg | ORAL_TABLET | Freq: Three times a day (TID) | ORAL | Status: DC | PRN

## 2013-03-06 NOTE — Assessment & Plan Note (Addendum)
New.  Pt was treated previously for panic attack at [facility] but never started daily SSRI.  Only took benzo PRN.  Pt reports sxs are increasing in frequency due to current home situation and concerns about finances.  Reviewed that pt does need daily controller med and that the lorazepam is to only be used for rescue purposes.  Will also check labs to r/o metabolic cause of anxiety such as hyperthyroid.  Pt in agreement.

## 2013-03-06 NOTE — Progress Notes (Signed)
  Subjective:    Patient ID: Richard Hanna, male    DOB: 07-Aug-1976, 37 y.o.   MRN: [id number]  HPI Anxiety/panic- pt was seen at [facility] in Feb for elevated BP, numbness/tingling in L arm.  Was started on Lorazepam to take as needed.  Never got the Paxil.  Pt reports hx of similar but was able to control them.  Now reports sxs are more frequent- anxious, 'feels like the walls are closing in'.  Pt having financial stress, home stress w/ wife's recent surgery, 3 boys at home.    Asthma- pt is out of inhalers and reports when he went to pick them up, they totalled $90.  Unable to afford this but continues to have SOB w/ exercise and due to this, has limited exercise and now deconditioned.   Review of Systems For ROS see HPI     Objective:   Physical Exam  Vitals reviewed. Constitutional: He is oriented to person, place, and time. He appears well-developed and well-nourished. No distress.  HENT:  Head: Normocephalic and atraumatic.  Eyes: Conjunctivae and EOM are normal. Pupils are equal, round, and reactive to light.  Neck: Normal range of motion. Neck supple. No thyromegaly present.  Cardiovascular: Normal rate, regular rhythm, normal heart sounds and intact distal pulses.   No murmur heard. Pulmonary/Chest: Effort normal and breath sounds normal. No respiratory distress.  Abdominal: Soft. Bowel sounds are normal. He exhibits no distension.  Musculoskeletal: He exhibits no edema.  Lymphadenopathy:    He has no cervical adenopathy.  Neurological: He is alert and oriented to person, place, and time. No cranial nerve deficit.  Skin: Skin is warm and dry.  Psychiatric: He has a normal mood and affect. His behavior is normal.          Assessment & Plan:

## 2013-03-06 NOTE — Assessment & Plan Note (Signed)
Deteriorated.  Pt's SOB now multifactorial- deconditioning and asthma.  Since pt unable to afford his inhalers, samples given of both Qvar and Proair.  Encouraged return to exercise.  Will follow.

## 2013-03-06 NOTE — Patient Instructions (Addendum)
Schedule a follow up visit in 4-6 weeks to recheck mood Restart the Qvar 1 puff twice daily Use the albuterol as needed Start the Prozac daily to control anxiety Use the Lorazepam as needed as a rescue pill Try and find a stress outlet- exercise, massage, etc Call with any questions or concerns Hang in there!

## 2013-03-08 ENCOUNTER — Encounter: Payer: Self-pay | Admitting: *Deleted

## 2013-05-02 ENCOUNTER — Encounter: Payer: Self-pay | Admitting: Family Medicine

## 2013-05-02 ENCOUNTER — Ambulatory Visit (INDEPENDENT_AMBULATORY_CARE_PROVIDER_SITE_OTHER): Payer: BC Managed Care – PPO | Admitting: Family Medicine

## 2013-05-02 VITALS — BP 122/84 | HR 61 | Temp 98.1°F | Ht 70.0 in | Wt 210.8 lb

## 2013-05-02 DIAGNOSIS — M62838 Other muscle spasm: Secondary | ICD-10-CM | POA: Insufficient documentation

## 2013-05-02 DIAGNOSIS — IMO0002 Reserved for concepts with insufficient information to code with codable children: Secondary | ICD-10-CM

## 2013-05-02 DIAGNOSIS — S29011A Strain of muscle and tendon of front wall of thorax, initial encounter: Secondary | ICD-10-CM

## 2013-05-02 MED ORDER — NAPROXEN 500 MG PO TABS: 500.0000 mg | ORAL_TABLET | Freq: Two times a day (BID) | ORAL | Status: AC

## 2013-05-02 MED ORDER — CYCLOBENZAPRINE HCL 10 MG PO TABS: 10.0000 mg | ORAL_TABLET | Freq: Three times a day (TID) | ORAL | Status: DC | PRN

## 2013-05-02 NOTE — Assessment & Plan Note (Signed)
New.  Pt's pain and tenderness consistent w/ spasm.  Start scheduled NSAIDs, muscle relaxer prn.  Heat/ice.  Gentle stretching.  Reviewed supportive care and red flags that should prompt return.  Pt expressed understanding and is in agreement w/ plan.

## 2013-05-02 NOTE — Progress Notes (Signed)
  Subjective:    Patient ID: Richard Hanna, male    DOB: 08-20-1976, 37 y.o.   MRN: [id number]  HPI R shoulder pain- 3-4 weeks of constant shoulder pain.  Difficulty sleeping.  No heavy lifting.  Pain is now radiating to R pec w/ coughing/laughing/sneezing.  No particular motion worsens pain.  No known injury.  No relief w/ ibuprofen.  No weakness or numbness of R arm.   Review of Systems For ROS see HPI     Objective:   Physical Exam  Vitals reviewed. Constitutional: He is oriented to person, place, and time. He appears well-developed and well-nourished. No distress.  Neck: Normal range of motion. Neck supple.  R trap spasm  Cardiovascular: Intact distal pulses.   Musculoskeletal:       Right shoulder: He exhibits tenderness (over R lower trap and R pectoralis insertion) and spasm (R lower trap). He exhibits normal range of motion, no bony tenderness, no swelling, no effusion, no deformity, normal pulse and normal strength.  (-) impingement signs  Neurological: He is alert and oriented to person, place, and time. He has normal reflexes. No cranial nerve deficit. Coordination normal.  Skin: Skin is warm and dry.          Assessment & Plan:

## 2013-05-02 NOTE — Assessment & Plan Note (Signed)
New.  Start scheduled NSAIDs.  Heat.  Gentle stretching.  Reviewed supportive care and red flags that should prompt return.  Pt expressed understanding and is in agreement w/ plan.

## 2013-05-02 NOTE — Patient Instructions (Addendum)
This appears to be a trap spasm and a pec strain Start the Naproxen twice daily- take w/ food Use the Flexeril (muscle relaxer) at night- will cause drowsiness HEAT! Do some gentle stretching to avoid stiffness If no improvement in the next 2 weeks- call me and we'll refer to ortho Hang in there!!!

## 2013-06-20 ENCOUNTER — Encounter: Payer: Self-pay | Admitting: Lab

## 2013-06-21 ENCOUNTER — Encounter: Payer: Self-pay | Admitting: Family Medicine

## 2013-06-21 ENCOUNTER — Ambulatory Visit (INDEPENDENT_AMBULATORY_CARE_PROVIDER_SITE_OTHER): Payer: BC Managed Care – PPO | Admitting: Family Medicine

## 2013-06-21 VITALS — BP 120/80 | HR 88 | Temp 98.6°F | Resp 16 | Wt 206.5 lb

## 2013-06-21 DIAGNOSIS — M26629 Arthralgia of temporomandibular joint, unspecified side: Secondary | ICD-10-CM

## 2013-06-21 MED ORDER — PREDNISONE 10 MG PO TABS: ORAL_TABLET | ORAL | Status: DC

## 2013-06-21 MED ORDER — CYCLOBENZAPRINE HCL 10 MG PO TABS: 10.0000 mg | ORAL_TABLET | Freq: Three times a day (TID) | ORAL | Status: DC | PRN

## 2013-06-21 NOTE — Patient Instructions (Signed)
Follow up as needed Start the prednisone as directed- take w/ food Use the flexeril at night for muscle spasm Get a bite guard to wear at night to keep from grinding your teeth Alternate heat and ice for pain relief Call with any questions or concerns Hang in there!!!

## 2013-06-21 NOTE — Progress Notes (Signed)
  Subjective:    Patient ID: Richard Hanna, male    DOB: 17-Apr-1976, 36 y.o.   MRN: [id number]  HPI TMJ- R sided.  Initially pain only w/ opening and closing jaw, particularly w/ chewing.  Now pain is constant and causing ear ache.  No fevers.  Pain to touch.  No known injury.  Pt chews sunflower seeds regularly.   Review of Systems For ROS see HPI     Objective:   Physical Exam  Vitals reviewed. Constitutional: He appears well-developed and well-nourished. No distress.  HENT:  Mouth/Throat: Oropharynx is clear and moist.  L masseter TTP Clicking and popping of L TMJ w/ opening and closing of jaw  Lymphadenopathy:    He has no cervical adenopathy.          Assessment & Plan:

## 2013-06-23 NOTE — Assessment & Plan Note (Signed)
New.  Start prednisone taper and muscle relaxer for pain relief.  Encouraged pt to get bite guard for nightly use.  Reviewed supportive care and red flags that should prompt return.  Pt expressed understanding and is in agreement w/ plan.

## 2015-10-03 ENCOUNTER — Encounter (HOSPITAL_COMMUNITY): Payer: Self-pay | Admitting: *Deleted

## 2015-10-03 ENCOUNTER — Emergency Department (HOSPITAL_COMMUNITY)
Admission: EM | Admit: 2015-10-03 | Discharge: 2015-10-03 | Disposition: A | Discharge status: Home / Self Care | Payer: 59 | Attending: Emergency Medicine | Admitting: Emergency Medicine

## 2015-10-03 ENCOUNTER — Emergency Department (HOSPITAL_COMMUNITY): Payer: 59

## 2015-10-03 DIAGNOSIS — J45901 Unspecified asthma with (acute) exacerbation: Secondary | ICD-10-CM | POA: Insufficient documentation

## 2015-10-03 DIAGNOSIS — R0602 Shortness of breath: Secondary | ICD-10-CM

## 2015-10-03 DIAGNOSIS — R05 Cough: Secondary | ICD-10-CM | POA: Diagnosis not present

## 2015-10-03 DIAGNOSIS — F410 Panic disorder [episodic paroxysmal anxiety] without agoraphobia: Secondary | ICD-10-CM | POA: Diagnosis not present

## 2015-10-03 DIAGNOSIS — Z8719 Personal history of other diseases of the digestive system: Secondary | ICD-10-CM | POA: Insufficient documentation

## 2015-10-03 DIAGNOSIS — Z79899 Other long term (current) drug therapy: Secondary | ICD-10-CM | POA: Diagnosis not present

## 2015-10-03 DIAGNOSIS — Z87891 Personal history of nicotine dependence: Secondary | ICD-10-CM | POA: Insufficient documentation

## 2015-10-03 DIAGNOSIS — R509 Fever, unspecified: Secondary | ICD-10-CM | POA: Diagnosis not present

## 2015-10-03 HISTORY — DX: Panic disorder (episodic paroxysmal anxiety): F41.0

## 2015-10-03 LAB — BASIC METABOLIC PANEL
Anion gap: 9 (ref 5–15)
BUN: 15 mg/dL (ref 6–20)
CO2: 25 mmol/L (ref 22–32)
CREATININE: 1.57 mg/dL — AB (ref 0.61–1.24)
Calcium: 9.2 mg/dL (ref 8.9–10.3)
Chloride: 107 mmol/L (ref 101–111)
GFR calc Af Amer: >60 mL/min (ref >60)
GFR calc non Af Amer: 54 mL/min — ABNORMAL LOW (ref >60)
GLUCOSE: 101 mg/dL — AB (ref 65–99)
POTASSIUM: 3.6 mmol/L (ref 3.5–5.1)
SODIUM: 141 mmol/L (ref 135–145)

## 2015-10-03 LAB — CBC WITH DIFFERENTIAL/PLATELET
Basophils Absolute: 0 10*3/uL (ref 0.0–0.1)
Basophils Relative: 0 %
EOS PCT: 2 %
Eosinophils Absolute: 0.2 10*3/uL (ref 0.0–0.7)
HCT: 43 % (ref 39.0–52.0)
Hemoglobin: 15 g/dL (ref 13.0–17.0)
LYMPHS PCT: 20 %
Lymphs Abs: 1.8 10*3/uL (ref 0.7–4.0)
MCH: 32.1 pg (ref 26.0–34.0)
MCHC: 34.9 g/dL (ref 30.0–36.0)
MCV: 92.1 fL (ref 78.0–100.0)
MONO ABS: 0.5 10*3/uL (ref 0.1–1.0)
MONOS PCT: 6 %
Neutro Abs: 6.5 10*3/uL (ref 1.7–7.7)
Neutrophils Relative %: 72 %
PLATELETS: 210 10*3/uL (ref 150–400)
RBC: 4.67 MIL/uL (ref 4.22–5.81)
RDW: 12.8 % (ref 11.5–15.5)
WBC: 9 10*3/uL (ref 4.0–10.5)

## 2015-10-03 LAB — I-STAT TROPONIN, ED: Troponin i, poc: 0.01 ng/mL (ref 0.00–0.08)

## 2015-10-03 MED ORDER — BENZONATATE 100 MG PO CAPS: 100.0000 mg | ORAL_CAPSULE | Freq: Three times a day (TID) | ORAL | Status: DC

## 2015-10-03 NOTE — Discharge Instructions (Signed)
1. Medications: tessalon for cough, usual home medications 2. Treatment: rest, drink plenty of fluids 3. Follow Up: please followup with your primary doctor for discussion of your diagnoses and further evaluation after today's visit; please return to the ER for chest pain, increased shortness of breath, new or worsening symptoms   Shortness of Breath Shortness of breath means you have trouble breathing. Shortness of breath needs medical care right away. HOME CARE   Do not smoke.  Avoid being around chemicals or things (paint fumes, dust) that may bother your breathing.  Rest as needed. Slowly begin your normal activities.  Only take medicines as told by your doctor.  Keep all doctor visits as told. GET HELP RIGHT AWAY IF:   Your shortness of breath gets worse.  You feel lightheaded, pass out (faint), or have a cough that is not helped by medicine.  You cough up blood.  You have pain with breathing.  You have pain in your chest, arms, shoulders, or belly (abdomen).  You have a fever.  You cannot walk up stairs or exercise the way you normally do.  You do not get better in the time expected.  You have a hard time doing normal activities even with rest.  You have problems with your medicines.  You have any new symptoms. MAKE SURE YOU:  Understand these instructions.  Will watch your condition.  Will get help right away if you are not doing well or get worse.   This information is not intended to replace advice given to you by your health care provider. Make sure you discuss any questions you have with your health care provider.   Document Released: 01/26/2008 Document Revised: 08/14/2013 Document Reviewed: 10/25/2011 Elsevier Interactive Patient Education [employer]

## 2015-10-03 NOTE — ED Provider Notes (Signed)
CSN: [id number]     Arrival date & time 10/03/15  1537 History   First MD Initiated Contact with Patient 10/03/15 1554     Chief Complaint  Patient presents with  . Shortness of Breath    HPI   Richard Hanna is a 40 y.o. male with a PMH of GERD, asthma, headaches, panic attacks who presents to the ED with shortness of breath, which he attributes to anxiety. He states he was walking back into work after eating lunch when he started to experience shortness of breath. He denies exacerbating or alleviating factors, and states his symptoms lasted for approximately 10 minutes. He reports associated lightheadedness, chills, and tingling in his hands bilaterally, and notes he told his supervisor to call 911. Per report, patient was febrile with EMS and was given 2 albuterol treatments and 1000 mg tylenol. He reports he feels much better now. Of note, he states he has had a cough productive of thick, clear sputum for the past week. He denies chest pain, abdominal pain, N/V, weakness, numbness, recent travel or immobility, recent surgery, history of malignancy, history of DVT/PE, hallucinations, SI/HI, alcohol or drug use. He notes he has experienced similar symptoms in the past with panic attacks.    Past Medical History  Diagnosis Date  . GERD (gastroesophageal reflux disease)   . Asthma   . Headache(784.0)   . Panic attacks    Past Surgical History  Procedure Laterality Date  . Ganglion cyst excision      L wrist   Family History  Problem Relation Age of Onset  . Hypertension Mother   . Hypertension Father   . Diabetes Father   . Diabetes Paternal Grandfather    Social History  Substance Use Topics  . Smoking status: Former Smoker -- 1.00 packs/day for 6 years    Types: Cigarettes    Quit date: 08/23/2002  . Smokeless tobacco: Never Used  . Alcohol Use: Yes     Review of Systems  Constitutional: Positive for fever and chills.  HENT: Negative for congestion.   Respiratory:  Positive for cough and shortness of breath.   Cardiovascular: Negative for chest pain.  Gastrointestinal: Negative for nausea, vomiting and abdominal pain.  Neurological: Positive for light-headedness. Negative for syncope, weakness and numbness.  All other systems reviewed and are negative.     Allergies  Review of patient's allergies indicates no known allergies.  Home Medications   Prior to Admission medications   Medication Sig Start Date End Date Taking? Authorizing Provider  aspirin-sod bicarb-citric acid (ALKA-SELTZER) 325 MG TBEF tablet Take 325 mg by mouth every 6 (six) hours as needed (acid reflex).   Yes Historical Provider, MD  benzonatate (TESSALON) 100 MG capsule Take 1 capsule (100 mg total) by mouth every 8 (eight) hours. 10/03/15   Mady Gemma, PA-C  cyclobenzaprine (FLEXERIL) 10 MG tablet Take 1 tablet (10 mg total) by mouth 3 (three) times daily as needed for muscle spasms. 06/21/13   Sheliah Hatch, MD  loratadine (CLARITIN) 10 MG tablet Take 1 tablet (10 mg total) by mouth daily. 01/04/12 01/03/13  Leslye Peer, MD  LORazepam (ATIVAN) 1 MG tablet Take 1 tablet (1 mg total) by mouth every 8 (eight) hours as needed for anxiety. 03/06/13   Sheliah Hatch, MD  predniSONE (DELTASONE) 10 MG tablet 3 tabs x3 days and then 2 tabs x3 days and then 1 tab x3 days.  Take w/ food. 06/21/13   Sheliah Hatch,  MD    BP 145/98 mmHg  Pulse 66  Temp(Src) 98 F (36.7 C) (Oral)  Resp 14  SpO2 98% Physical Exam  Constitutional: He is oriented to person, place, and time. He appears well-developed and well-nourished. No distress.  HENT:  Head: Normocephalic and atraumatic.  Right Ear: External ear normal.  Left Ear: External ear normal.  Nose: Nose normal.  Mouth/Throat: Uvula is midline, oropharynx is clear and moist and mucous membranes are normal.  Eyes: Conjunctivae, EOM and lids are normal. Pupils are equal, round, and reactive to light. Right eye exhibits  no discharge. Left eye exhibits no discharge. No scleral icterus.  Neck: Normal range of motion. Neck supple.  Cardiovascular: Normal rate, regular rhythm, normal heart sounds, intact distal pulses and normal pulses.   Pulmonary/Chest: Effort normal and breath sounds normal. No respiratory distress. He has no wheezes. He has no rales.  Abdominal: Soft. Normal appearance and bowel sounds are normal. He exhibits no distension and no mass. There is no tenderness. There is no rigidity, no rebound and no guarding.  Musculoskeletal: Normal range of motion. He exhibits no edema or tenderness.  Neurological: He is alert and oriented to person, place, and time. He has normal strength. No cranial nerve deficit or sensory deficit.  Skin: Skin is warm, dry and intact. No rash noted. He is not diaphoretic. No erythema. No pallor.  Psychiatric: He has a normal mood and affect. His speech is normal and behavior is normal.  Nursing note and vitals reviewed.   ED Course  Procedures (including critical care time)  Labs Review Labs Reviewed  BASIC METABOLIC PANEL - Abnormal; Notable for the following:    Glucose, Bld 101 (*)    Creatinine, Ser 1.57 (*)    GFR calc non Af Amer 54 (*)    All other components within normal limits  CBC WITH DIFFERENTIAL/PLATELET  Rosezena Sensor, ED    Imaging Review Dg Chest 2 View  10/03/2015  CLINICAL DATA:  Chest pain.  Cough and fever. EXAM: CHEST  2 VIEW COMPARISON:  06/12/2012 FINDINGS: The heart size and mediastinal contours are within normal limits. Both lungs are clear. The visualized skeletal structures are unremarkable. IMPRESSION: Normal chest. Electronically Signed   By: Francene Boyers M.D.   On: 10/03/2015 16:36     I have personally reviewed and evaluated these images and lab results as part of my medical decision-making.   EKG Interpretation   Date/Time:  Friday October 03 2015 16:46:30 EST Ventricular Rate:  76 PR Interval:  176 QRS Duration:  83 QT Interval:  386 QTC Calculation: 434 R Axis:   34 Text Interpretation:  Sinus rhythm No previous tracing Normal ECG  Confirmed by KNOTT MD, Reuel Boom ([id number]) on 10/03/2015 4:49:51 PM      MDM   Final diagnoses:  Shortness of breath    40 year old male presents with shortness of breath, which he states occurred today prior to arrival and lasted for approximately 10 minutes. Notes his symptoms feel consistent with his history of panic attacks. Reports associated lightheadedness, chills, and tingling in his hands bilaterally. Patient given tylenol with EMS. Patient afebrile in the ED. Hypertensive. No tachypnea or hypoxia. Heart regular rate and rhythm. Lungs clear to auscultation bilaterally. No increased work of breathing or respiratory distress. Normal neuro exam with no focal deficit.   EKG sinus rhythm, HR 76. Troponin negative. CXR negative. CBC no leukocytosis or anemia. BMP remarkable for creatinine 1.57, which appears chronic. Patient is non-toxic and  well-appearing, feel he is stable for discharge at this time. BP improved prior to discharge. Symptoms may be related to anxiety. HEART score 1 given risk factors (HTN, tobacco use), doubt ACS given unremarkable work-up in the ED. Patient is PERC negative, low suspicion for PE. Spoke with patient regarding smoking cessation. Patient to follow-up with PCP for further evaluation and management. Return precautions discussed. Patient verbalizes his understanding and is in agreement with plan.   BP 145/98 mmHg  Pulse 66  Temp(Src) 98 F (36.7 C) (Oral)  Resp 14  SpO2 98%     Mady Gemma, PA-C 10/03/15 2056  Lyndal Pulley, MD 10/04/15 [phone]

## 2015-10-03 NOTE — ED Notes (Signed)
Pt to xray

## 2015-10-03 NOTE — ED Notes (Signed)
Patient is alert and oriented x4. EMS was called to work for patient after patient started having shortness of breath. Patient has a history of panic attacks in the past.  Currently the patient is feeling better than before on arrival to ED. Patient was given 2 albuterol treatments and  1000 mg of tylenol.

## 2015-10-03 NOTE — ED Notes (Signed)
Bed: YI50 Expected date:  Expected time:  Means of arrival:  Comments: EMS- shortness of breath, febrile

## 2016-01-30 ENCOUNTER — Ambulatory Visit: Payer: 59 | Admitting: Family Medicine

## 2016-01-30 ENCOUNTER — Telehealth: Payer: Self-pay | Admitting: Family Medicine

## 2016-02-02 NOTE — Telephone Encounter (Signed)
No charge. 

## 2016-02-02 NOTE — Telephone Encounter (Signed)
Pt was no show 01/30/16 for acute appt, pt was rescheduled for 02/04/16 by Angelique Blonderenise, no reason noted, 1st no show win 12 months, charge or no charge?

## 2016-02-04 ENCOUNTER — Encounter: Payer: Self-pay | Admitting: Family Medicine

## 2016-02-04 ENCOUNTER — Ambulatory Visit (INDEPENDENT_AMBULATORY_CARE_PROVIDER_SITE_OTHER): Payer: 59 | Admitting: Family Medicine

## 2016-02-04 VITALS — BP 136/90 | HR 98 | Temp 98.1°F | Resp 16 | Ht 70.0 in | Wt 207.5 lb

## 2016-02-04 DIAGNOSIS — R631 Polydipsia: Secondary | ICD-10-CM | POA: Diagnosis not present

## 2016-02-04 DIAGNOSIS — F411 Generalized anxiety disorder: Secondary | ICD-10-CM

## 2016-02-04 DIAGNOSIS — Z833 Family history of diabetes mellitus: Secondary | ICD-10-CM | POA: Diagnosis not present

## 2016-02-04 DIAGNOSIS — B351 Tinea unguium: Secondary | ICD-10-CM | POA: Diagnosis not present

## 2016-02-04 MED ORDER — CITALOPRAM HYDROBROMIDE 20 MG PO TABS: 20.0000 mg | ORAL_TABLET | Freq: Every day | ORAL | Status: DC

## 2016-02-04 NOTE — Assessment & Plan Note (Signed)
Deteriorated.  Pt now having panic attacks and having anxiety much more frequently than previous.  Based on this, pt needs to start daily controller medication to help lessen his anxiety.  Will start low dose Celexa and monitor closely for improvement.  Pt expressed understanding and is in agreement w/ plan.

## 2016-02-04 NOTE — Patient Instructions (Signed)
Follow up in 4-6 weeks to recheck anxiety Please go to our [place] office ([address]- across from [facility]) to get your labs done Start the Celexa once daily to help w/ anxiety Make sure you are eating regularly- making healthy food choices and getting regular exercise If the liver enzymes look ok, we can start a medication for the nail fungus Call with any questions or concerns Hang in there!!!

## 2016-02-04 NOTE — Assessment & Plan Note (Signed)
New.  Pt's nails are yellow, thickened, brittle.  No improvement w/ OTC treatments.  If LFTs normal will plan to start systemic tx w/ Lamisil.  Pt expressed understanding and is in agreement w/ plan.

## 2016-02-04 NOTE — Progress Notes (Signed)
   Subjective:    Patient ID: Richard Hanna, male    DOB: 01-Mar-1976, 40 y.o.   MRN: [id number]  HPI Discolored feet- pt reports toenails are discolored.  Pt reports nails are peeling and thickened.  Pt has tried OTC nail fungus creams and sprays w/o results.  Anxiety- pt reports 'attacks are more frequent'.  Pt had 2 significant attacks 3-4 months ago that required the ambulance to come to work.  He had SOB at this time w/ elevated BP.  Pt reports he can now be driving and begin to feel overwhelmed, 'like i'm going to black out or something'.  Increased anxiety caused him to restart smoking.    Increased thirst and urination- pt has family hx of diabetes.  Pt reports recent weight loss- was up as high as 225 and now down to 208.  Pt has not been trying to lose weight but admits to poor eating habits.     Review of Systems For ROS see HPI     Objective:   Physical Exam  Constitutional: He is oriented to person, place, and time. He appears well-developed and well-nourished. No distress.  HENT:  Head: Normocephalic and atraumatic.  Eyes: Conjunctivae and EOM are normal. Pupils are equal, round, and reactive to light.  Neck: Normal range of motion. Neck supple. No thyromegaly present.  Cardiovascular: Normal rate, regular rhythm, normal heart sounds and intact distal pulses.   No murmur heard. Pulmonary/Chest: Effort normal and breath sounds normal. No respiratory distress.  Abdominal: Soft. Bowel sounds are normal. He exhibits no distension.  Musculoskeletal: He exhibits no edema.  Lymphadenopathy:    He has no cervical adenopathy.  Neurological: He is alert and oriented to person, place, and time. No cranial nerve deficit.  Skin: Skin is warm and dry.  Nail fungus on bilateral great toenails and 5th toenails- thickened, yellowed, peeling  Psychiatric: He has a normal mood and affect. His behavior is normal.  Vitals reviewed.         Assessment & Plan:

## 2016-02-04 NOTE — Assessment & Plan Note (Signed)
New.  Pt has increased thirst, increased urination, and unexplained weight loss.  These sxs, paired w/ his family hx of DM, are concerning for possible new diabetes dx.  Check labs.  Stressed importance of healthy diet and regular exercise.  Will follow closely.

## 2016-02-04 NOTE — Progress Notes (Signed)
Pre visit review using our clinic review tool, if applicable. No additional management support is needed unless otherwise documented below in the visit note. 

## 2016-03-05 DIAGNOSIS — M549 Dorsalgia, unspecified: Secondary | ICD-10-CM | POA: Diagnosis not present

## 2016-03-05 DIAGNOSIS — M545 Low back pain: Secondary | ICD-10-CM | POA: Diagnosis not present

## 2016-03-05 DIAGNOSIS — S299XXA Unspecified injury of thorax, initial encounter: Secondary | ICD-10-CM | POA: Diagnosis not present

## 2016-03-10 ENCOUNTER — Encounter: Payer: Self-pay | Admitting: General Practice

## 2016-03-11 ENCOUNTER — Telehealth: Payer: Self-pay | Admitting: General Practice

## 2016-03-11 ENCOUNTER — Ambulatory Visit: Payer: 59 | Admitting: Family Medicine

## 2016-03-11 DIAGNOSIS — Z0289 Encounter for other administrative examinations: Secondary | ICD-10-CM

## 2016-03-11 NOTE — Telephone Encounter (Signed)
Pt needs a no show fee 

## 2016-03-11 NOTE — Telephone Encounter (Signed)
Pt spouse came into the office today for a 3:15 appt and advised pt would not be in for his 3pm appt. This was canceled and pt not rescheduled. Please advise if NS fee or not?

## 2016-05-20 ENCOUNTER — Ambulatory Visit (HOSPITAL_COMMUNITY)
Admission: EM | Admit: 2016-05-20 | Discharge: 2016-05-20 | Disposition: A | Discharge status: Nursing Home (Includes ICF) | Payer: 59 | Attending: Emergency Medicine | Admitting: Emergency Medicine

## 2016-05-20 ENCOUNTER — Encounter (HOSPITAL_COMMUNITY): Payer: Self-pay | Admitting: Emergency Medicine

## 2016-05-20 ENCOUNTER — Emergency Department (HOSPITAL_COMMUNITY)
Admission: EM | Admit: 2016-05-20 | Discharge: 2016-05-21 | Disposition: A | Discharge status: Home / Self Care | Payer: 59 | Attending: Emergency Medicine | Admitting: Emergency Medicine

## 2016-05-20 ENCOUNTER — Encounter (HOSPITAL_COMMUNITY): Payer: Self-pay | Admitting: Family Medicine

## 2016-05-20 DIAGNOSIS — R55 Syncope and collapse: Secondary | ICD-10-CM

## 2016-05-20 DIAGNOSIS — Z5321 Procedure and treatment not carried out due to patient leaving prior to being seen by health care provider: Secondary | ICD-10-CM | POA: Insufficient documentation

## 2016-05-20 DIAGNOSIS — Z87891 Personal history of nicotine dependence: Secondary | ICD-10-CM | POA: Insufficient documentation

## 2016-05-20 DIAGNOSIS — R51 Headache: Secondary | ICD-10-CM | POA: Diagnosis not present

## 2016-05-20 DIAGNOSIS — J45909 Unspecified asthma, uncomplicated: Secondary | ICD-10-CM | POA: Insufficient documentation

## 2016-05-20 DIAGNOSIS — R410 Disorientation, unspecified: Secondary | ICD-10-CM

## 2016-05-20 DIAGNOSIS — R42 Dizziness and giddiness: Secondary | ICD-10-CM | POA: Diagnosis not present

## 2016-05-20 DIAGNOSIS — R7989 Other specified abnormal findings of blood chemistry: Secondary | ICD-10-CM

## 2016-05-20 DIAGNOSIS — R519 Headache, unspecified: Secondary | ICD-10-CM

## 2016-05-20 DIAGNOSIS — R0602 Shortness of breath: Secondary | ICD-10-CM | POA: Diagnosis not present

## 2016-05-20 LAB — CBC WITH DIFFERENTIAL/PLATELET
BASOS PCT: 1 %
Basophils Absolute: 0.1 10*3/uL (ref 0.0–0.1)
EOS ABS: 0.4 10*3/uL (ref 0.0–0.7)
Eosinophils Relative: 5 %
HCT: 46.2 % (ref 39.0–52.0)
Hemoglobin: 15.4 g/dL (ref 13.0–17.0)
Lymphocytes Relative: 27 %
Lymphs Abs: 2.3 10*3/uL (ref 0.7–4.0)
MCH: 31 pg (ref 26.0–34.0)
MCHC: 33.3 g/dL (ref 30.0–36.0)
MCV: 93 fL (ref 78.0–100.0)
MONOS PCT: 5 %
Monocytes Absolute: 0.4 10*3/uL (ref 0.1–1.0)
NEUTROS PCT: 62 %
Neutro Abs: 5.3 10*3/uL (ref 1.7–7.7)
PLATELETS: 202 10*3/uL (ref 150–400)
RBC: 4.97 MIL/uL (ref 4.22–5.81)
RDW: 13.1 % (ref 11.5–15.5)
WBC: 8.5 10*3/uL (ref 4.0–10.5)

## 2016-05-20 LAB — I-STAT TROPONIN, ED: Troponin i, poc: 0.02 ng/mL (ref 0.00–0.08)

## 2016-05-20 LAB — POCT I-STAT, CHEM 8
BUN: 20 mg/dL (ref 6–20)
Calcium, Ion: 1.22 mmol/L (ref 1.15–1.40)
Chloride: 105 mmol/L (ref 101–111)
Creatinine, Ser: 1.6 mg/dL — ABNORMAL HIGH (ref 0.61–1.24)
Glucose, Bld: 71 mg/dL (ref 65–99)
HEMATOCRIT: 48 % (ref 39.0–52.0)
HEMOGLOBIN: 16.3 g/dL (ref 13.0–17.0)
POTASSIUM: 4.2 mmol/L (ref 3.5–5.1)
SODIUM: 142 mmol/L (ref 135–145)
TCO2: 27 mmol/L (ref 0–100)

## 2016-05-20 LAB — COMPREHENSIVE METABOLIC PANEL
ALBUMIN: 4.5 g/dL (ref 3.5–5.0)
ALT: 13 U/L — ABNORMAL LOW (ref 17–63)
ANION GAP: 9 (ref 5–15)
AST: 19 U/L (ref 15–41)
Alkaline Phosphatase: 35 U/L — ABNORMAL LOW (ref 38–126)
BUN: 15 mg/dL (ref 6–20)
CO2: 24 mmol/L (ref 22–32)
Calcium: 9.2 mg/dL (ref 8.9–10.3)
Chloride: 106 mmol/L (ref 101–111)
Creatinine, Ser: 1.58 mg/dL — ABNORMAL HIGH (ref 0.61–1.24)
GFR calc Af Amer: >60 mL/min (ref >60)
GFR calc non Af Amer: 53 mL/min — ABNORMAL LOW (ref >60)
GLUCOSE: 83 mg/dL (ref 65–99)
POTASSIUM: 4.2 mmol/L (ref 3.5–5.1)
Sodium: 139 mmol/L (ref 135–145)
Total Bilirubin: 0.8 mg/dL (ref 0.3–1.2)
Total Protein: 7.3 g/dL (ref 6.5–8.1)

## 2016-05-20 NOTE — ED Notes (Signed)
Pt here for anxiety and panic attacks. sts that this has been going on for years. sts that today he had sharp pains in his head and then became SOB and then almost blacked out. sts he still feels anxious and pt currently not taking any meds.

## 2016-05-20 NOTE — ED Triage Notes (Signed)
Pt st's he was sitting on couch watching TV when he began to feel hot then had a syncopal episode.  St's when he came to he felt like he couldn't catch his breath.  No shortness of breath at this time.  Skin warm and dry color appropriate

## 2016-05-20 NOTE — ED Notes (Signed)
Called and unable to locate

## 2016-05-20 NOTE — ED Provider Notes (Signed)
CSN: [id number]     Arrival date & time 05/20/16  1429 History   First MD Initiated Contact with Patient 05/20/16 1702     No chief complaint on file.  (Consider location/radiation/quality/duration/timing/severity/associated sxs/prior Treatment) 40 year old male states he was sitting in his chair at home with his grandchild and watching TV. He had a sudden onset of dizziness, sharp pains in the head and shortness of breath. This was followed by a period of disorientation and syncope. Unknown time of unconsciousness. Patient states he also had sharp chest pains. He states he has never had similar events and does not know what may have caused it. Denies diabetes, heart or lung disease. When asked about any other symptoms or possible contributing information he states that he has had a little bit of a cough, and sometimes he will feel really hot and then cold.  EMR indicates history of anxiety however patient never mentions this and states that his symptoms today have never occurred before.                      Past Medical History:  Diagnosis Date  . Asthma   . GERD (gastroesophageal reflux disease)   . Headache(784.0)   . Panic attacks    Past Surgical History:  Procedure Laterality Date  . GANGLION CYST EXCISION     L wrist   Family History  Problem Relation Age of Onset  . Hypertension Mother   . Hypertension Father   . Diabetes Father   . Diabetes Paternal Grandfather    Social History  Substance Use Topics  . Smoking status: Former Smoker    Packs/day: 1.00    Years: 6.00    Types: Cigarettes    Quit date: 08/23/2002  . Smokeless tobacco: Never Used  . Alcohol use Yes    Review of Systems  Constitutional: Positive for activity change. Negative for fever.  HENT: Positive for congestion and rhinorrhea.   Eyes: Negative.   Respiratory: Positive for cough and shortness of breath.   Cardiovascular: Positive for chest pain. Negative for leg swelling.   Gastrointestinal: Negative.   Genitourinary: Negative.   Skin: Negative.   Neurological: Positive for dizziness, syncope and weakness. Negative for headaches.  Psychiatric/Behavioral:       Patient offers no psychiatric complaint.  All other systems reviewed and are negative.   Allergies  Review of patient's allergies indicates no known allergies.  Home Medications   Prior to Admission medications   Not on File   Meds Ordered and Administered this Visit  Medications - No data to display  BP 149/88 (BP Location: Left Arm)   Pulse 71   Temp 98.7 F (37.1 C) (Oral)   Resp 16   SpO2 100%  No data found.   Physical Exam  Constitutional: He is oriented to person, place, and time. He appears well-developed and well-nourished. No distress.  HENT:  Head: Normocephalic and atraumatic.  Mouth/Throat: No oropharyngeal exudate.  Bilateral TMs are normal. Oropharynx with cobblestoning and clear PND.  Eyes: EOM are normal. Pupils are equal, round, and reactive to light.  Right eye with mildly erythematous conjunctivae and minor scleral injection.  Neck: Normal range of motion. Neck supple.  Cardiovascular: Normal rate, regular rhythm, normal heart sounds and intact distal pulses.   Pulmonary/Chest: Effort normal and breath sounds normal. He exhibits no tenderness.  Musculoskeletal: Normal range of motion. He exhibits no edema.  Neurological: He is alert and oriented to person, place, and  time. Coordination normal.  Skin: Skin is warm and dry. No rash noted. No erythema.  Psychiatric: He has a normal mood and affect.  Nursing note and vitals reviewed.   Urgent Care Course   Clinical Course    Procedures (including critical care time)  Labs Review Labs Reviewed  POCT I-STAT, CHEM 8 - Abnormal; Notable for the following:       Result Value   Creatinine, Ser 1.60 (*)    All other components within normal limits   Results for orders placed or performed during the hospital  encounter of 05/20/16  I-STAT, chem 8  Result Value Ref Range   Sodium 142 135 - 145 mmol/L   Potassium 4.2 3.5 - 5.1 mmol/L   Chloride 105 101 - 111 mmol/L   BUN 20 6 - 20 mg/dL   Creatinine, Ser 1.611.60 (H) 0.61 - 1.24 mg/dL   Glucose, Bld 71 65 - 99 mg/dL   Calcium, Ion 0.961.22 0.451.15 - 1.40 mmol/L   TCO2 27 0 - 100 mmol/L   Hemoglobin 16.3 13.0 - 17.0 g/dL   HCT 40.948.0 81.139.0 - 91.452.0 %   ED ECG REPORT   Date: 05/20/2016  Rate: 66  Rhythm: normal sinus rhythm  QRS Axis: normal  Intervals: normal  ST/T Wave abnormalities: normal  Conduction Disutrbances:none  Narrative Interpretation:   Old EKG Reviewed: unchanged  I have personally reviewed the EKG tracing and agree with the computerized printout as noted.   Imaging Review No results found.   Visual Acuity Review  Right Eye Distance:   Left Eye Distance:   Bilateral Distance:    Right Eye Near:   Left Eye Near:    Bilateral Near:         MDM   1. Atypical syncope   2. Dizziness   3. Acute nonintractable headache, unspecified headache type   4. Transient disorientation   5. Elevated serum creatinine    Patient had an episode of syncope associated with shortness of breath, chest pain, sharp pain in the head dizziness and disorientation while watching TV. He has a loss to explain why this may have occurred. He has no cardiopulmonary history. He has a history of anxiety but fails to mention this even when prodded. He states he has never had symptoms like this before. Do not wish to assume that this was a typical anxiety episode since he cannot confirm this. Patient is being transferred to [facility] in a stable condition via shuttle.    Hayden Rasmussenavid Aimi Essner, NP 05/20/16 1755    Hayden Rasmussenavid Iliya Spivack, NP 05/20/16 1758

## 2016-06-02 ENCOUNTER — Ambulatory Visit (INDEPENDENT_AMBULATORY_CARE_PROVIDER_SITE_OTHER): Payer: 59 | Admitting: Family Medicine

## 2016-06-02 ENCOUNTER — Encounter: Payer: Self-pay | Admitting: Family Medicine

## 2016-06-02 VITALS — BP 144/82 | HR 72 | Temp 99.0°F | Resp 16 | Ht 71.0 in | Wt 214.4 lb

## 2016-06-02 DIAGNOSIS — R631 Polydipsia: Secondary | ICD-10-CM

## 2016-06-02 DIAGNOSIS — G4730 Sleep apnea, unspecified: Secondary | ICD-10-CM

## 2016-06-02 DIAGNOSIS — B351 Tinea unguium: Secondary | ICD-10-CM

## 2016-06-02 DIAGNOSIS — I10 Essential (primary) hypertension: Secondary | ICD-10-CM

## 2016-06-02 DIAGNOSIS — Z833 Family history of diabetes mellitus: Secondary | ICD-10-CM | POA: Diagnosis not present

## 2016-06-02 DIAGNOSIS — F411 Generalized anxiety disorder: Secondary | ICD-10-CM | POA: Diagnosis not present

## 2016-06-02 LAB — LIPID PANEL
Cholesterol: 164 mg/dL (ref 0–200)
HDL: 66.2 mg/dL (ref >39.00)
LDL Cholesterol: 87 mg/dL (ref 0–99)
NonHDL: 97.57
TRIGLYCERIDES: 53 mg/dL (ref 0.0–149.0)
Total CHOL/HDL Ratio: 2
VLDL: 10.6 mg/dL (ref 0.0–40.0)

## 2016-06-02 LAB — CBC WITH DIFFERENTIAL/PLATELET
Basophils Absolute: 0 10*3/uL (ref 0.0–0.1)
Basophils Relative: 0.6 % (ref 0.0–3.0)
EOS PCT: 6 % — AB (ref 0.0–5.0)
Eosinophils Absolute: 0.4 10*3/uL (ref 0.0–0.7)
HEMATOCRIT: 44 % (ref 39.0–52.0)
HEMOGLOBIN: 14.8 g/dL (ref 13.0–17.0)
LYMPHS ABS: 2 10*3/uL (ref 0.7–4.0)
Lymphocytes Relative: 30.7 % (ref 12.0–46.0)
MCHC: 33.6 g/dL (ref 30.0–36.0)
MCV: 92.6 fl (ref 78.0–100.0)
MONOS PCT: 5.6 % (ref 3.0–12.0)
Monocytes Absolute: 0.4 10*3/uL (ref 0.1–1.0)
Neutro Abs: 3.7 10*3/uL (ref 1.4–7.7)
Neutrophils Relative %: 57.1 % (ref 43.0–77.0)
Platelets: 222 10*3/uL (ref 150.0–400.0)
RBC: 4.74 Mil/uL (ref 4.22–5.81)
RDW: 13.4 % (ref 11.5–15.5)
WBC: 6.5 10*3/uL (ref 4.0–10.5)

## 2016-06-02 LAB — HEPATIC FUNCTION PANEL
ALBUMIN: 3.9 g/dL (ref 3.5–5.2)
ALK PHOS: 36 U/L — AB (ref 39–117)
ALT: 12 U/L (ref 0–53)
AST: 15 U/L (ref 0–37)
Bilirubin, Direct: 0.1 mg/dL (ref 0.0–0.3)
TOTAL PROTEIN: 6.5 g/dL (ref 6.0–8.3)
Total Bilirubin: 0.4 mg/dL (ref 0.2–1.2)

## 2016-06-02 LAB — BASIC METABOLIC PANEL
BUN: 14 mg/dL (ref 6–23)
CO2: 26 mEq/L (ref 19–32)
Calcium: 9.2 mg/dL (ref 8.4–10.5)
Chloride: 106 mEq/L (ref 96–112)
Creatinine, Ser: 1.41 mg/dL (ref 0.40–1.50)
GFR: 71.19 mL/min (ref >60.00)
GLUCOSE: 90 mg/dL (ref 70–99)
Potassium: 4.1 mEq/L (ref 3.5–5.1)
Sodium: 142 mEq/L (ref 135–145)

## 2016-06-02 LAB — TSH: TSH: 1.48 u[IU]/mL (ref 0.35–4.50)

## 2016-06-02 LAB — HEMOGLOBIN A1C: Hgb A1c MFr Bld: 5.5 % (ref 4.6–6.5)

## 2016-06-02 MED ORDER — ALPRAZOLAM 0.5 MG PO TABS: 0.5000 mg | ORAL_TABLET | Freq: Two times a day (BID) | ORAL | 1 refills | Status: DC | PRN

## 2016-06-02 MED ORDER — PROPRANOLOL HCL ER 60 MG PO CP24: 60.0000 mg | ORAL_CAPSULE | Freq: Every day | ORAL | 3 refills | Status: DC

## 2016-06-02 MED FILL — PROPRANOLOL ER 60 MG CAP: 60 | 30 days supply | Qty: 30 | Fill #0

## 2016-06-02 MED FILL — ALPRAZolam 0.5 MG TABS: 0.5 | 15 days supply | Qty: 30 | Fill #0

## 2016-06-02 NOTE — Progress Notes (Signed)
   Subjective:    Patient ID: Richard Hanna, male    DOB: 1976/06/06, 40 y.o.   MRN: [id number]  HPI Atypical syncope- pt had event on 9/28 and [facility]  [facility] but he left w/o being seen.  Pt reports sxs felt consistent w/ anxiety attack but he has no recollection of blacking out after waking on the couch a few minutes later.  Wife reports that pt has apneic episodes at night.  Home BPs have been elevated to 150-160s/110s for last week.  Pt had normal EKG and troponin at [facility].   Review of Systems For ROS see HPI     Objective:   Physical Exam  Constitutional: He is oriented to person, place, and time. He appears well-developed and well-nourished. No distress.  HENT:  Head: Normocephalic and atraumatic.  Eyes: Conjunctivae and EOM are normal. Pupils are equal, round, and reactive to light.  Neck: Normal range of motion. Neck supple. No thyromegaly present.  Cardiovascular: Normal rate, regular rhythm, normal heart sounds and intact distal pulses.   No murmur heard. Pulmonary/Chest: Effort normal and breath sounds normal. No respiratory distress.  Abdominal: Soft. Bowel sounds are normal. He exhibits no distension.  Musculoskeletal: He exhibits no edema.  Lymphadenopathy:    He has no cervical adenopathy.  Neurological: He is alert and oriented to person, place, and time. No cranial nerve deficit.  Skin: Skin is warm and dry.  Psychiatric: He has a normal mood and affect. His behavior is normal.  Vitals reviewed.         Assessment & Plan:

## 2016-06-02 NOTE — Patient Instructions (Signed)
Follow up in 3 week to recheck BP and anxiety We'll notify you of your lab results and make any changes if needed Start the Propranolol once daily to improve BP and anxiety Use the Alprazolam as needed for those panicked moments Try and work on healthy diet and regular exercise- it's a good stress reliever We'll call you with your sleep study to assess for possible sleep apnea Call with any questions or concerns Hang in there!!!

## 2016-06-02 NOTE — Progress Notes (Signed)
Pre visit review using our clinic review tool, if applicable. No additional management support is needed unless otherwise documented below in the visit note. 

## 2016-06-06 NOTE — Assessment & Plan Note (Signed)
Ongoing issue for pt.  He has been resistant to accept this dx in the past and even more resistant to starting a controller medication.  Will start low dose alprazolam for him to use in panicked situations and start a daily beta blocker to control both BP and anxiety.  If more control is needed, will again revisit starting SSRI.  Pt expressed understanding and is in agreement w/ plan.

## 2016-06-06 NOTE — Assessment & Plan Note (Signed)
New.  Pt's home BPs have been elevated and BP is again elevated today.  Add beta blocker to improve both BP and anxiety.  Discussed need for stress management.  If no improvement in BP on beta blocker, will need to add additional medication.  Reviewed supportive care and red flags that should prompt return.  Pt expressed understanding and is in agreement w/ plan.

## 2016-06-25 ENCOUNTER — Ambulatory Visit: Payer: 59 | Admitting: Family Medicine

## 2016-06-25 DIAGNOSIS — Z0289 Encounter for other administrative examinations: Secondary | ICD-10-CM

## 2016-07-12 MED FILL — PROPRANOLOL ER 60 MG CAP: 60 | 30 days supply | Qty: 30 | Fill #1

## 2016-07-22 ENCOUNTER — Institutional Professional Consult (permissible substitution): Payer: 59 | Admitting: Pulmonary Disease

## 2016-08-30 ENCOUNTER — Encounter: Payer: Self-pay | Admitting: General Practice

## 2016-08-30 ENCOUNTER — Ambulatory Visit (INDEPENDENT_AMBULATORY_CARE_PROVIDER_SITE_OTHER): Payer: 59 | Admitting: Family Medicine

## 2016-08-30 ENCOUNTER — Encounter: Payer: Self-pay | Admitting: Family Medicine

## 2016-08-30 VITALS — BP 140/80 | HR 104 | Temp 100.6°F | Resp 16 | Ht 71.0 in | Wt 213.5 lb

## 2016-08-30 DIAGNOSIS — J111 Influenza due to unidentified influenza virus with other respiratory manifestations: Secondary | ICD-10-CM | POA: Diagnosis not present

## 2016-08-30 DIAGNOSIS — R6889 Other general symptoms and signs: Secondary | ICD-10-CM

## 2016-08-30 LAB — POC INFLUENZA A&B (BINAX/QUICKVUE)
INFLUENZA A, POC: POSITIVE — AB
INFLUENZA B, POC: NEGATIVE

## 2016-08-30 MED ORDER — OSELTAMIVIR PHOSPHATE 75 MG PO CAPS: 75.0000 mg | ORAL_CAPSULE | Freq: Two times a day (BID) | ORAL | 0 refills | Status: DC

## 2016-08-30 MED FILL — OSELTAMIVIR PHOS 75 MG CAP: 75 | 5 days supply | Qty: 10 | Fill #0

## 2016-08-30 NOTE — Patient Instructions (Signed)
Follow up as needed/scheduled Start the Tamiflu twice daily x5 days Drink plenty of fluids REST!!! Alternate tylenol and ibuprofen for pain and fever Call with any questions or concerns Hang in there!!!

## 2016-08-30 NOTE — Progress Notes (Signed)
   Subjective:    Patient ID: Richard Hanna, male    DOB: Dec 09, 1975, 41 y.o.   MRN: [id number]  HPI Flu- sxs started yesterday, suddenly.  + body aches, fever, chills, HA.  Mild cough- dry, made it difficult to sleep.  + nasal congestion.  No ear pain.   Review of Systems For ROS see HPI     Objective:   Physical Exam  Constitutional: He is oriented to person, place, and time. He appears well-developed and well-nourished. No distress.  Obviously uncomfortable  HENT:  Head: Normocephalic and atraumatic.  Right Ear: Tympanic membrane normal.  Left Ear: Tympanic membrane normal.  Nose: Right sinus exhibits no maxillary sinus tenderness and no frontal sinus tenderness. Left sinus exhibits no maxillary sinus tenderness and no frontal sinus tenderness.  Mouth/Throat: Oropharynx is clear and moist. No oropharyngeal exudate.  Neck: Normal range of motion. Neck supple.  Cardiovascular: Normal rate, regular rhythm and normal heart sounds.   Pulmonary/Chest: Effort normal and breath sounds normal. No respiratory distress. He has no wheezes. He has no rales.  Lymphadenopathy:    He has no cervical adenopathy.  Neurological: He is alert and oriented to person, place, and time.  Skin: Skin is warm. No rash noted.  Vitals reviewed.         Assessment & Plan:  Flu- pt's sxs and lack of bacterial infxn are consistent w/ flu.  Rapid test confirms.  Start Tamiflu.  Reviewed supportive care and red flags that should prompt return.  Pt expressed understanding and is in agreement w/ plan.

## 2016-08-30 NOTE — Progress Notes (Signed)
Pre visit review using our clinic review tool, if applicable. No additional management support is needed unless otherwise documented below in the visit note. 

## 2016-10-11 ENCOUNTER — Other Ambulatory Visit: Payer: Self-pay | Admitting: Family Medicine

## 2016-10-11 NOTE — Telephone Encounter (Signed)
Last OV 08/30/16 (flu-like symptoms) Alprazolam last filled 06/02/16 #30 with 1

## 2016-10-11 NOTE — Telephone Encounter (Signed)
Pt called requesting refills on BP and Anxiety meds, he also needs his pharmacy changed to [facility] [address] [place]

## 2016-10-12 MED ORDER — PROPRANOLOL HCL ER 60 MG PO CP24: 60.0000 mg | ORAL_CAPSULE | Freq: Every day | ORAL | 3 refills | Status: DC

## 2016-10-12 MED ORDER — ALPRAZOLAM 0.5 MG PO TABS: 0.5000 mg | ORAL_TABLET | Freq: Two times a day (BID) | ORAL | 1 refills | Status: DC | PRN

## 2016-12-16 ENCOUNTER — Encounter: Payer: Self-pay | Admitting: Family Medicine

## 2016-12-16 ENCOUNTER — Encounter: Payer: Self-pay | Admitting: General Practice

## 2016-12-16 ENCOUNTER — Ambulatory Visit (INDEPENDENT_AMBULATORY_CARE_PROVIDER_SITE_OTHER): Payer: Managed Care, Other (non HMO) | Admitting: Family Medicine

## 2016-12-16 VITALS — BP 140/92 | HR 57 | Temp 98.1°F | Resp 17 | Ht 71.0 in | Wt 228.2 lb

## 2016-12-16 DIAGNOSIS — I10 Essential (primary) hypertension: Secondary | ICD-10-CM

## 2016-12-16 DIAGNOSIS — F411 Generalized anxiety disorder: Secondary | ICD-10-CM

## 2016-12-16 DIAGNOSIS — G4733 Obstructive sleep apnea (adult) (pediatric): Secondary | ICD-10-CM | POA: Diagnosis not present

## 2016-12-16 LAB — BASIC METABOLIC PANEL
BUN: 21 mg/dL (ref 6–23)
CALCIUM: 9.7 mg/dL (ref 8.4–10.5)
CO2: 28 meq/L (ref 19–32)
Chloride: 107 mEq/L (ref 96–112)
Creatinine, Ser: 1.38 mg/dL (ref 0.40–1.50)
GFR: 72.79 mL/min (ref >60.00)
Glucose, Bld: 93 mg/dL (ref 70–99)
Potassium: 4.2 mEq/L (ref 3.5–5.1)
SODIUM: 141 meq/L (ref 135–145)

## 2016-12-16 LAB — CBC WITH DIFFERENTIAL/PLATELET
Basophils Absolute: 0 10*3/uL (ref 0.0–0.1)
Basophils Relative: 0.8 % (ref 0.0–3.0)
Eosinophils Absolute: 0.4 10*3/uL (ref 0.0–0.7)
Eosinophils Relative: 6.4 % — ABNORMAL HIGH (ref 0.0–5.0)
HCT: 44.1 % (ref 39.0–52.0)
Hemoglobin: 14.9 g/dL (ref 13.0–17.0)
LYMPHS ABS: 1.9 10*3/uL (ref 0.7–4.0)
LYMPHS PCT: 32 % (ref 12.0–46.0)
MCHC: 33.9 g/dL (ref 30.0–36.0)
MCV: 91.2 fl (ref 78.0–100.0)
Monocytes Absolute: 0.5 10*3/uL (ref 0.1–1.0)
Monocytes Relative: 7.8 % (ref 3.0–12.0)
NEUTROS ABS: 3.2 10*3/uL (ref 1.4–7.7)
NEUTROS PCT: 53 % (ref 43.0–77.0)
PLATELETS: 204 10*3/uL (ref 150.0–400.0)
RBC: 4.84 Mil/uL (ref 4.22–5.81)
RDW: 13.5 % (ref 11.5–15.5)
WBC: 6.1 10*3/uL (ref 4.0–10.5)

## 2016-12-16 LAB — TSH: TSH: 1.17 u[IU]/mL (ref 0.35–4.50)

## 2016-12-16 MED ORDER — VALSARTAN 80 MG PO TABS: 80.0000 mg | ORAL_TABLET | Freq: Every day | ORAL | 3 refills | Status: DC

## 2016-12-16 NOTE — Progress Notes (Signed)
Pre visit review using our clinic review tool, if applicable. No additional management support is needed unless otherwise documented below in the visit note. 

## 2016-12-16 NOTE — Progress Notes (Signed)
   Subjective:    Patient ID: Richard Hanna, male    DOB: 05-06-1976, 41 y.o.   MRN: [id number]  HPI Anxiety- chronic problem.  Taking Alprazolam prn.  Pt reports palms will sweat, 'can't sit still'.  Pt reports sxs worsen 'when I'm alone w/ my thoughts'.    HTN- chronic problem, pt's BP was 'really really high' this AM (174/125).  Pt reports he has had increased anxiety, hot flashes that have not been responding to xanax.  Currently on Propranolol  daily.  Pt has been unable to quit smoking but has cut back dramatically.  + family hx of HTN.  No CP, SOB.  + HAs.  Denies flushing or diarrhea.   Review of Systems For ROS see HPI     Objective:   Physical Exam  Constitutional: He is oriented to person, place, and time. He appears well-developed and well-nourished. No distress.  HENT:  Head: Normocephalic and atraumatic.  Eyes: Conjunctivae and EOM are normal. Pupils are equal, round, and reactive to light.  Neck: Normal range of motion. Neck supple. No thyromegaly present.  Cardiovascular: Normal rate, regular rhythm, normal heart sounds and intact distal pulses.   No murmur heard. Pulmonary/Chest: Effort normal and breath sounds normal. No respiratory distress.  Abdominal: Soft. Bowel sounds are normal. He exhibits no distension.  Musculoskeletal: He exhibits no edema.  Lymphadenopathy:    He has no cervical adenopathy.  Neurological: He is alert and oriented to person, place, and time. No cranial nerve deficit.  Skin: Skin is warm and dry.  Psychiatric: He has a normal mood and affect. His behavior is normal.  Vitals reviewed.         Assessment & Plan:

## 2016-12-16 NOTE — Assessment & Plan Note (Signed)
Deteriorated.  Pt's BP here is not at goal and home readings are even higher.  Having HAs associated w/ elevated readings.  It is difficult to say whether pt's BP is causing his anxiety or his anxiety is causing his elevated BP, but regardless, given his family hx he needs better control.  Continue beta blocker as this helps w/ anxiety and add ARB for better control.  Also discussed that untreated OSA can elevate BP- refer to pulmonary for complete evaluation.  Stressed need for healthy diet, regular exercise, smoking cessation, and stress management.  Will follow closely.

## 2016-12-16 NOTE — Assessment & Plan Note (Signed)
Ongoing issue for pt.  sxs seem to be worsening- flushed feeling, sweaty palms.  He has been resistant to taking daily controller medication in the past but he is reconsidering this position.  Once BP is better controlled, if pt is still having sxs, we will again discuss need to start SSRI.  Pt expressed understanding and is in agreement w/ plan.

## 2016-12-16 NOTE — Patient Instructions (Signed)
Follow up in 2-3 weeks to recheck BP We'll notify you of your lab results and make any changes if needed Continue the Propranolol daily ADD the Valsartan daily to improve BP We'll call you with your pulmonary appt Try and make healthy food choices, get regular exercise, and stop smoking to improve BP- you can do it!!! Try and find a stress outlet- music, exercise, art, etc- this will help control anxiety Call with any questions or concerns Hang in there!  We'll get this under control!

## 2016-12-22 ENCOUNTER — Encounter: Payer: Self-pay | Admitting: Pulmonary Disease

## 2016-12-22 ENCOUNTER — Ambulatory Visit (INDEPENDENT_AMBULATORY_CARE_PROVIDER_SITE_OTHER): Payer: Managed Care, Other (non HMO) | Admitting: Pulmonary Disease

## 2016-12-22 VITALS — BP 120/72 | HR 66 | Ht 71.0 in | Wt 226.0 lb

## 2016-12-22 DIAGNOSIS — J4520 Mild intermittent asthma, uncomplicated: Secondary | ICD-10-CM

## 2016-12-22 DIAGNOSIS — G4733 Obstructive sleep apnea (adult) (pediatric): Secondary | ICD-10-CM | POA: Diagnosis not present

## 2016-12-22 NOTE — Assessment & Plan Note (Signed)
Home sleep study will be scheduled. Please keep sleep diary for 2 weeks  Given excessive daytime somnolence, narrow pharyngeal exam, witnessed apneas & loud snoring, obstructive sleep apnea is very likely & an overnight polysomnogram will be scheduled as a home study. The pathophysiology of obstructive sleep apnea , it's cardiovascular consequences & modes of treatment including CPAP were discused with the patient in detail & they evidenced understanding.  Atypical features include frequency of sleep paralysis, hence we'll have him keep sleep diary If home sleep study shows OSA then we'll proceed with attended CPAP titration to also study sleep architecture.

## 2016-12-22 NOTE — Assessment & Plan Note (Signed)
Use albuterol 2 puffs prior to exercise Check spirometry in the future

## 2016-12-22 NOTE — Patient Instructions (Signed)
Home sleep study will be scheduled. Please keep sleep diary for 2 weeks  Congratulations on quitting smoking

## 2016-12-22 NOTE — Progress Notes (Signed)
Subjective:    Patient ID: Richard Hanna, male    DOB: [birth year], 41 y.o.   MRN: [id number]  HPI  Chief Complaint  Patient presents with  . Sleep Consult    Pt states when he wakes up in the morning, he still feels tired. Per wife, he snores at night and appears to stop breathing at times. Has not had a sleep study before.     41 year old smoker presents for evaluation of sleep-disordered breathing. 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 His wife who is an Charity fundraiser and has told him about loud snoring and witnessed apneas during sleep. He reports non-refreshing sleep. Epworth sleepiness score is 12 and he reports sleepiness while watching TV, as a passenger in a car or lying down to rest in the afternoons area Bedtime is anywhere between 10 PM and 1 AM, sleep latency can be a couple of hours, he sleeps on his side with 3 pillows. This is because he is afraid to sleep on his back. He reports occasions where he is awake and cannot move especially when he wakes up from sleep. Reports 3-4 nocturnal awakenings and is finally out of bed by 6:30 AM feeling tired without dryness of mouth or headaches. He takes 30 minute naps during his lunch breaks. On weekends he stays in bed up to 8 AM but still feels fatigued. He just quit smoking about 2 weeks ago and his weight has increased by 20 pounds  He carries a diagnosis of exercise-induced asthma and feels that this is gotten worse in recent years to the point where he feels it when he is playing with his kids. I note that he was maintained on Qvar at one point but this was stopped. Spirometry was never done and he does not have a rescue inhaler as an active prescription anymore  He has hypertension controlled with 2 medications and takes a beta blocker in the afternoon    Past Medical History:  Diagnosis Date  . Asthma   . GERD (gastroesophageal reflux disease)   . Headache(784.0)   . Panic attacks    Past Surgical History:  Procedure  Laterality Date  . GANGLION CYST EXCISION     L wrist   No Known Allergies  Social History   Social History  . Marital status: Single    Spouse name: N/A  . Number of children: N/A  . Years of education: N/A   Occupational History  . Not on file.   Social History Main Topics  . Smoking status: Former Smoker    Packs/day: 1.00    Years: 6.00    Types: Cigarettes    Quit date: 08/23/2002  . Smokeless tobacco: Never Used  . Alcohol use Yes  . Drug use: No  . Sexual activity: Not on file   Other Topics Concern  . Not on file   Social History Narrative  . No narrative on file     Family History  Problem Relation Age of Onset  . Hypertension Mother   . Hypertension Father   . Diabetes Father   . Diabetes Paternal Grandfather      Review of Systems Constitutional: negative for anorexia, fevers and sweats  Eyes: negative for irritation, redness and visual disturbance  Ears, nose, mouth, throat, and face: negative for earaches, epistaxis, nasal congestion and sore throat  Respiratory: negative for cough, dyspnea on exertion, sputum and wheezing  Cardiovascular: negative for chest pain, dyspnea, lower extremity edema, orthopnea, palpitations  and syncope  Gastrointestinal: negative for abdominal pain, constipation, diarrhea, melena, nausea and vomiting  Genitourinary:negative for dysuria, frequency and hematuria  Hematologic/lymphatic: negative for bleeding, easy bruising and lymphadenopathy  Musculoskeletal:negative for arthralgias, muscle weakness and stiff joints  Neurological: negative for coordination problems, gait problems, headaches and weakness  Endocrine: negative for diabetic symptoms including polydipsia, polyuria and weight loss     Objective:   Physical Exam  Gen. Pleasant, well-nourished, in no distress, normal affect ENT - no lesions, no post nasal drip, class 2 airway Neck: No JVD, no thyromegaly, no carotid bruits Lungs: no use of accessory  muscles, no dullness to percussion, clear without rales or rhonchi  Cardiovascular: Rhythm regular, heart sounds  normal, no murmurs or gallops, no peripheral edema Abdomen: soft and non-tender, no hepatosplenomegaly, BS normal. Musculoskeletal: No deformities, no cyanosis or clubbing Neuro:  alert, non focal       Assessment & Plan:

## 2016-12-31 ENCOUNTER — Ambulatory Visit: Payer: Managed Care, Other (non HMO) | Admitting: Family Medicine

## 2017-01-06 ENCOUNTER — Encounter: Payer: Self-pay | Admitting: Pulmonary Disease

## 2017-01-07 ENCOUNTER — Encounter: Payer: Self-pay | Admitting: General Practice

## 2017-01-27 ENCOUNTER — Encounter: Payer: Self-pay | Admitting: Family Medicine

## 2017-02-03 ENCOUNTER — Encounter: Payer: Self-pay | Admitting: Family Medicine

## 2017-02-18 ENCOUNTER — Encounter: Payer: Self-pay | Admitting: Family Medicine

## 2017-02-18 ENCOUNTER — Ambulatory Visit (INDEPENDENT_AMBULATORY_CARE_PROVIDER_SITE_OTHER): Payer: Managed Care, Other (non HMO) | Admitting: Family Medicine

## 2017-02-18 VITALS — BP 125/87 | HR 82 | Temp 99.0°F | Resp 16 | Ht 71.0 in | Wt 231.0 lb

## 2017-02-18 DIAGNOSIS — F411 Generalized anxiety disorder: Secondary | ICD-10-CM

## 2017-02-18 DIAGNOSIS — K219 Gastro-esophageal reflux disease without esophagitis: Secondary | ICD-10-CM

## 2017-02-18 DIAGNOSIS — I10 Essential (primary) hypertension: Secondary | ICD-10-CM

## 2017-02-18 LAB — BASIC METABOLIC PANEL
BUN: 15 mg/dL (ref 6–23)
CALCIUM: 9.5 mg/dL (ref 8.4–10.5)
CO2: 31 mEq/L (ref 19–32)
CREATININE: 1.55 mg/dL — AB (ref 0.40–1.50)
Chloride: 105 mEq/L (ref 96–112)
GFR: 63.6 mL/min (ref >60.00)
Glucose, Bld: 85 mg/dL (ref 70–99)
Potassium: 4.2 mEq/L (ref 3.5–5.1)
Sodium: 142 mEq/L (ref 135–145)

## 2017-02-18 LAB — CBC WITH DIFFERENTIAL/PLATELET
BASOS PCT: 0.7 % (ref 0.0–3.0)
Basophils Absolute: 0 10*3/uL (ref 0.0–0.1)
EOS PCT: 5.4 % — AB (ref 0.0–5.0)
Eosinophils Absolute: 0.3 10*3/uL (ref 0.0–0.7)
HCT: 41.2 % (ref 39.0–52.0)
HEMOGLOBIN: 14 g/dL (ref 13.0–17.0)
LYMPHS ABS: 2 10*3/uL (ref 0.7–4.0)
Lymphocytes Relative: 35.2 % (ref 12.0–46.0)
MCHC: 34 g/dL (ref 30.0–36.0)
MCV: 90.2 fl (ref 78.0–100.0)
MONO ABS: 0.4 10*3/uL (ref 0.1–1.0)
Monocytes Relative: 7.6 % (ref 3.0–12.0)
Neutro Abs: 2.9 10*3/uL (ref 1.4–7.7)
Neutrophils Relative %: 51.1 % (ref 43.0–77.0)
Platelets: 189 10*3/uL (ref 150.0–400.0)
RBC: 4.57 Mil/uL (ref 4.22–5.81)
RDW: 12.9 % (ref 11.5–15.5)
WBC: 5.7 10*3/uL (ref 4.0–10.5)

## 2017-02-18 LAB — LIPID PANEL
Cholesterol: 170 mg/dL (ref 0–200)
HDL: 49.9 mg/dL (ref >39.00)
LDL Cholesterol: 107 mg/dL — ABNORMAL HIGH (ref 0–99)
NonHDL: 119.84
Total CHOL/HDL Ratio: 3
Triglycerides: 62 mg/dL (ref 0.0–149.0)
VLDL: 12.4 mg/dL (ref 0.0–40.0)

## 2017-02-18 LAB — HEPATIC FUNCTION PANEL
ALK PHOS: 34 U/L — AB (ref 39–117)
ALT: 14 U/L (ref 0–53)
AST: 17 U/L (ref 0–37)
Albumin: 4.5 g/dL (ref 3.5–5.2)
BILIRUBIN DIRECT: 0.1 mg/dL (ref 0.0–0.3)
BILIRUBIN TOTAL: 0.6 mg/dL (ref 0.2–1.2)
Total Protein: 7 g/dL (ref 6.0–8.3)

## 2017-02-18 LAB — TSH: TSH: 2.23 u[IU]/mL (ref 0.35–4.50)

## 2017-02-18 MED ORDER — ALPRAZOLAM 0.5 MG PO TABS: 0.5000 mg | ORAL_TABLET | Freq: Two times a day (BID) | ORAL | 1 refills | Status: DC | PRN

## 2017-02-18 MED ORDER — OMEPRAZOLE 40 MG PO CPDR
40.0000 mg | DELAYED_RELEASE_CAPSULE | Freq: Every day | ORAL | 3 refills | Status: DC
Start: 2017-02-18 — End: 2017-10-30

## 2017-02-18 NOTE — Patient Instructions (Signed)
Schedule your complete physical in 6 months We'll notify you of your lab results and make any changes if needed Continue to work on healthy diet and regular exercise- you can do it!!! Continue the Propranolol and Valsartan daily for blood pressure START the Omeprazole once daily for reflux Use the Alprazolam only as needed for anxiety I AM SO PROUD OF YOU FOR QUITTING SMOKING! Call with any questions or concerns Have a great summer!

## 2017-02-18 NOTE — Assessment & Plan Note (Signed)
Improved.  Pt is doing MUCH better since BP has come down and he quit smoking.  Refill provided on Alprazolam just in case he has moments of panic.  Pt expressed understanding and is in agreement w/ plan.

## 2017-02-18 NOTE — Progress Notes (Signed)
Pre visit review using our clinic review tool, if applicable. No additional management support is needed unless otherwise documented below in the visit note. 

## 2017-02-18 NOTE — Progress Notes (Signed)
   Subjective:    Patient ID: Richard Hanna, male    DOB: 1976/01/26, 41 y.o.   MRN: [id number]  HPI HTN- chronic problem, on Valsartan and Propanolol daily.  Quit smoking ~2 months ago.  Pt reports feeling better since he stopped smoking.  No CP, SOB above baseline, HAs, visual changes, edema.  Anxiety- pt reports 'it's not as bad as it used to be'.  Pt has been out of Alprazolam and has not taken recently.  Pt reports anxiety seems to be work driven.  Pt reports his panicked moments are fewer and less severe.  GERD- pt reports ~2 weeks of near constant mouth watering.  sxs improve w/ alka-seltzer.   Review of Systems For ROS see HPI     Objective:   Physical Exam  Constitutional: He is oriented to person, place, and time. He appears well-developed and well-nourished. No distress.  HENT:  Head: Normocephalic and atraumatic.  Eyes: Conjunctivae and EOM are normal. Pupils are equal, round, and reactive to light.  Neck: Normal range of motion. Neck supple. No thyromegaly present.  Cardiovascular: Normal rate, regular rhythm, normal heart sounds and intact distal pulses.   No murmur heard. Pulmonary/Chest: Effort normal and breath sounds normal. No respiratory distress.  Abdominal: Soft. Bowel sounds are normal. He exhibits no distension.  Musculoskeletal: He exhibits no edema.  Lymphadenopathy:    He has no cervical adenopathy.  Neurological: He is alert and oriented to person, place, and time. No cranial nerve deficit.  Skin: Skin is warm and dry.  Psychiatric: He has a normal mood and affect. His behavior is normal.  Vitals reviewed.         Assessment & Plan:

## 2017-02-18 NOTE — Assessment & Plan Note (Signed)
Chronic problem.  Adequate control since starting Valsartan.  Asymptomatic.  Check labs.  No anticipated med changes.  Will follow.

## 2017-02-18 NOTE — Assessment & Plan Note (Signed)
Deteriorated.  Reviewed dietary and lifestyle modifications.  Start PPI.  Will follow.

## 2017-02-27 ENCOUNTER — Other Ambulatory Visit: Payer: Self-pay | Admitting: Family Medicine

## 2017-03-29 ENCOUNTER — Other Ambulatory Visit: Payer: Self-pay | Admitting: Emergency Medicine

## 2017-03-29 MED ORDER — LOSARTAN POTASSIUM 50 MG PO TABS: 50.0000 mg | ORAL_TABLET | Freq: Every day | ORAL | 0 refills | Status: DC

## 2017-03-29 NOTE — Progress Notes (Signed)
Received rx response from [facility] wanting an alternative medication for the Valsartan recall/shortage. PCP out of office. Richard Hanna, changed Valsartan 80 mg to Losartan 50 mg daily. Will contact patient to notify of medication change and to follow up for blood pressure check. LMOVM to call back to schedule an appointment

## 2017-04-27 ENCOUNTER — Other Ambulatory Visit: Payer: Self-pay | Admitting: Physician Assistant

## 2017-07-25 ENCOUNTER — Other Ambulatory Visit: Payer: Self-pay | Admitting: General Practice

## 2017-07-25 MED ORDER — LOSARTAN POTASSIUM 50 MG PO TABS: 50.0000 mg | ORAL_TABLET | Freq: Every day | ORAL | 0 refills | Status: DC

## 2017-10-30 ENCOUNTER — Other Ambulatory Visit: Payer: Self-pay | Admitting: Family Medicine

## 2018-05-01 ENCOUNTER — Emergency Department (HOSPITAL_COMMUNITY)
Admission: EM | Admit: 2018-05-01 | Discharge: 2018-05-01 | Disposition: A | Discharge status: Home / Self Care | Payer: Self-pay | Attending: Emergency Medicine | Admitting: Emergency Medicine

## 2018-05-01 ENCOUNTER — Other Ambulatory Visit: Payer: Self-pay

## 2018-05-01 ENCOUNTER — Encounter (HOSPITAL_COMMUNITY): Payer: Self-pay

## 2018-05-01 ENCOUNTER — Emergency Department (HOSPITAL_COMMUNITY): Payer: Self-pay

## 2018-05-01 DIAGNOSIS — G8921 Chronic pain due to trauma: Secondary | ICD-10-CM | POA: Insufficient documentation

## 2018-05-01 DIAGNOSIS — Z79899 Other long term (current) drug therapy: Secondary | ICD-10-CM | POA: Insufficient documentation

## 2018-05-01 DIAGNOSIS — Z87891 Personal history of nicotine dependence: Secondary | ICD-10-CM | POA: Insufficient documentation

## 2018-05-01 DIAGNOSIS — Y9289 Other specified places as the place of occurrence of the external cause: Secondary | ICD-10-CM | POA: Insufficient documentation

## 2018-05-01 DIAGNOSIS — M79605 Pain in left leg: Secondary | ICD-10-CM

## 2018-05-01 DIAGNOSIS — W2209XA Striking against other stationary object, initial encounter: Secondary | ICD-10-CM | POA: Insufficient documentation

## 2018-05-01 DIAGNOSIS — Y9389 Activity, other specified: Secondary | ICD-10-CM | POA: Insufficient documentation

## 2018-05-01 DIAGNOSIS — Y990 Civilian activity done for income or pay: Secondary | ICD-10-CM | POA: Insufficient documentation

## 2018-05-01 NOTE — ED Triage Notes (Signed)
Patient c/o left leg swelling since 03/29/2018. Patient states he tripped over a drawer at work and continues to have pain and swelling.

## 2018-05-01 NOTE — ED Provider Notes (Signed)
[facility]-EMERGENCY DEPT Provider Note   CSN: [id number] Arrival date & time: 05/01/18  0818     History   Chief Complaint Chief Complaint  Patient presents with  . Leg Swelling    HPI Richard Hanna is a 42 y.o. male.  The history is provided by the patient.  Leg Pain   This is a new problem. The current episode started more than 1 week ago. The problem occurs daily. The problem has been gradually improving. The pain is present in the left lower leg. The quality of the pain is described as aching. The pain is at a severity of 3/10. The pain is mild. Pertinent negatives include no numbness, full range of motion, no stiffness, no tingling and no itching. Exacerbated by: nothing, intermittent swelling of the left ankle  He has tried OTC pain medications for the symptoms. The treatment provided moderate relief. There has been a history of trauma (Patient struck his left shin area several weeks ago at work on a dresser draw in his shin area and has intermittent pain and swelling. No new trauma). Family history is significant for no rheumatoid arthritis and no gout.    Past Medical History:  Diagnosis Date  . Asthma   . GERD (gastroesophageal reflux disease)   . Headache(784.0)   . Panic attacks     Patient Active Problem List   Diagnosis Date Noted  . OSA (obstructive sleep apnea) 12/22/2016  . Essential hypertension 06/02/2016  . Toenail fungus 02/04/2016  . Increased thirst 02/04/2016  . TMJ arthralgia 06/21/2013  . Trapezius muscle spasm 05/02/2013  . Pectoralis muscle strain 05/02/2013  . Generalized anxiety disorder 03/06/2013  . URI 10/14/2010  . RHINITIS 10/14/2010  . G E R D 09/22/2010  . HEADACHE, CHRONIC 09/22/2010  . Asthma 08/28/2010    Past Surgical History:  Procedure Laterality Date  . GANGLION CYST EXCISION     L wrist        Home Medications    Prior to Admission medications   Medication Sig Start Date End Date Taking?  Authorizing Provider  ALPRAZolam Prudy Feeler) 0.5 MG tablet Take 1 tablet (0.5 mg total) by mouth 2 (two) times daily as needed for anxiety. 02/18/17   Sheliah Hatch, MD  losartan (COZAAR) 50 MG tablet Take 1 tablet (50 mg total) by mouth daily. 07/25/17   Sheliah Hatch, MD  omeprazole (PRILOSEC) 40 MG capsule Take 1 capsule (40 mg total) by mouth daily. Call [phone] to schedule a Blood Pressure follow up 10/31/17   Sheliah Hatch, MD  propranolol ER (INDERAL LA) 60 MG 24 hr capsule TAKE ONE CAPSULE BY MOUTH EVERY DAY 02/28/17   Sheliah Hatch, MD    Family History Family History  Problem Relation Age of Onset  . Hypertension Mother   . Hypertension Father   . Diabetes Father   . Diabetes Paternal Grandfather     Social History Social History   Tobacco Use  . Smoking status: Former Smoker    Packs/day: 1.00    Years: 6.00    Pack years: 6.00    Types: Cigarettes    Last attempt to quit: 08/23/2002    Years since quitting: 15.6  . Smokeless tobacco: Never Used  Substance Use Topics  . Alcohol use: Yes  . Drug use: No     Allergies   Patient has no known allergies.   Review of Systems Review of Systems  Constitutional: Negative for chills and  fever.  HENT: Negative for ear pain and sore throat.   Eyes: Negative for pain and visual disturbance.  Respiratory: Negative for cough and shortness of breath.   Cardiovascular: Negative for chest pain and palpitations.  Gastrointestinal: Negative for abdominal pain and vomiting.  Genitourinary: Negative for dysuria and hematuria.  Musculoskeletal: Positive for gait problem. Negative for arthralgias, back pain and stiffness.  Skin: Negative for color change, itching and rash.  Neurological: Negative for tingling, seizures, syncope and numbness.  All other systems reviewed and are negative.    Physical Exam Updated Vital Signs  ED Triage Vitals  Enc Vitals Group     BP --      Pulse --      Resp --      Temp  --      Temp src --      SpO2 --      Weight 05/01/18 0831 244 lb 2 oz (110.7 kg)     Height 05/01/18 0831 5\' 11"  (1.803 m)     Head Circumference --      Peak Flow --      Pain Score 05/01/18 0830 3     Pain Loc --      Pain Edu? --      Excl. in GC? --     Physical Exam  Constitutional: He is oriented to person, place, and time. He appears well-developed and well-nourished.  HENT:  Head: Normocephalic and atraumatic.  Eyes: Pupils are equal, round, and reactive to light. Conjunctivae and EOM are normal.  Neck: Neck supple.  Cardiovascular: Normal rate, regular rhythm, normal heart sounds and intact distal pulses.  No murmur heard. Pulmonary/Chest: Effort normal and breath sounds normal. No respiratory distress.  Abdominal: Soft. There is no tenderness.  Musculoskeletal: Normal range of motion. He exhibits tenderness (TTP to anterior left shin area ). He exhibits no edema or deformity.  Neurological: He is alert and oriented to person, place, and time.  Skin: Skin is warm and dry.  Psychiatric: He has a normal mood and affect.  Nursing note and vitals reviewed.    ED Treatments / Results  Labs (all labs ordered are listed, but only abnormal results are displayed) Labs Reviewed - No data to display  EKG None  Radiology Dg Tibia/fibula Left  Result Date: 05/01/2018 CLINICAL DATA:  Left lower leg pain after fall last month at work. EXAM: LEFT TIBIA AND FIBULA - 2 VIEW COMPARISON:  None. FINDINGS: There is no evidence of fracture or other focal bone lesions. Soft tissues are unremarkable. IMPRESSION: Normal left tibia and fibula. Electronically Signed   By: Lupita Raider, M.D.   On: 05/01/2018 08:55    Procedures Procedures (including critical care time)  Medications Ordered in ED Medications - No data to display   Initial Impression / Assessment and Plan / ED Course  I have reviewed the triage vital signs and the nursing notes.  Pertinent labs & imaging results  that were available during my care of the patient were reviewed by me and considered in my medical decision making (see chart for details).     Richard Hanna is a 42 year old male with no significant medical history who presents to the ED with left leg pain and swelling.  Patient with normal vitals.  No fever.  Patient struck his left lower leg on a drawer at work and has had intermittent pain and swelling since.  Patient did not have imaging at the time of  the injury.  Patient states that he has taken Motrin with improvement but continues to have some swelling intermittently.  Patient denies any new trauma.  He is neurovascularly intact on exam.  He is tender in the anterior shin area but otherwise no signs to suggest DVT or arterial process at this time.  He does appear to have some soft tissue abnormality on the anterior shin likely from trauma.  Likely has some subcutaneous tissue injury from trauma.  X-ray shows no acute fractures or malalignment.  Suspect patient just with ongoing healing process at this time and continue to use Tylenol/Motrin as needed.  Swelling likely from repetitive use with walking and recommend continue elevation of his leg and Motrin which has helped.  Given return instructions and discharged from ED in  good condition.  Recommend follow-up with primary care doctor.  Final Clinical Impressions(s) / ED Diagnoses   Final diagnoses:  Left leg pain    ED Discharge Orders    None       Virgina Norfolk, DO 05/01/18 1610

## 2018-05-02 ENCOUNTER — Telehealth: Payer: Self-pay

## 2018-05-02 NOTE — Telephone Encounter (Signed)
LM requesting call back to schedule ER f/u with PCP.  

## 2018-08-24 ENCOUNTER — Ambulatory Visit: Payer: Self-pay | Admitting: Family Medicine

## 2018-08-31 ENCOUNTER — Ambulatory Visit: Payer: Self-pay | Admitting: Family Medicine

## 2018-08-31 ENCOUNTER — Encounter: Payer: Self-pay | Admitting: Family Medicine

## 2018-08-31 ENCOUNTER — Ambulatory Visit (INDEPENDENT_AMBULATORY_CARE_PROVIDER_SITE_OTHER): Payer: BLUE CROSS/BLUE SHIELD | Admitting: Family Medicine

## 2018-08-31 ENCOUNTER — Other Ambulatory Visit: Payer: Self-pay

## 2018-08-31 VITALS — BP 139/84 | HR 81 | Temp 97.9°F | Resp 16 | Ht 71.0 in | Wt 243.1 lb

## 2018-08-31 DIAGNOSIS — E669 Obesity, unspecified: Secondary | ICD-10-CM

## 2018-08-31 DIAGNOSIS — F411 Generalized anxiety disorder: Secondary | ICD-10-CM

## 2018-08-31 DIAGNOSIS — G4733 Obstructive sleep apnea (adult) (pediatric): Secondary | ICD-10-CM | POA: Diagnosis not present

## 2018-08-31 DIAGNOSIS — I10 Essential (primary) hypertension: Secondary | ICD-10-CM | POA: Diagnosis not present

## 2018-08-31 DIAGNOSIS — N529 Male erectile dysfunction, unspecified: Secondary | ICD-10-CM

## 2018-08-31 DIAGNOSIS — E66811 Obesity, class 1: Secondary | ICD-10-CM

## 2018-08-31 LAB — HEPATIC FUNCTION PANEL
ALBUMIN: 4.8 g/dL (ref 3.5–5.2)
ALT: 20 U/L (ref 0–53)
AST: 18 U/L (ref 0–37)
Alkaline Phosphatase: 36 U/L — ABNORMAL LOW (ref 39–117)
BILIRUBIN DIRECT: 0.2 mg/dL (ref 0.0–0.3)
TOTAL PROTEIN: 7.4 g/dL (ref 6.0–8.3)
Total Bilirubin: 0.7 mg/dL (ref 0.2–1.2)

## 2018-08-31 LAB — LIPID PANEL
CHOL/HDL RATIO: 4
Cholesterol: 192 mg/dL (ref 0–200)
HDL: 50.9 mg/dL (ref >39.00)
LDL CALC: 126 mg/dL — AB (ref 0–99)
NonHDL: 140.84
TRIGLYCERIDES: 72 mg/dL (ref 0.0–149.0)
VLDL: 14.4 mg/dL (ref 0.0–40.0)

## 2018-08-31 LAB — CBC WITH DIFFERENTIAL/PLATELET
Basophils Absolute: 0 10*3/uL (ref 0.0–0.1)
Basophils Relative: 0.7 % (ref 0.0–3.0)
Eosinophils Absolute: 0.3 10*3/uL (ref 0.0–0.7)
Eosinophils Relative: 5.1 % — ABNORMAL HIGH (ref 0.0–5.0)
HCT: 45.2 % (ref 39.0–52.0)
HEMOGLOBIN: 15.4 g/dL (ref 13.0–17.0)
Lymphocytes Relative: 33.9 % (ref 12.0–46.0)
Lymphs Abs: 2.1 10*3/uL (ref 0.7–4.0)
MCHC: 34.1 g/dL (ref 30.0–36.0)
MCV: 90 fl (ref 78.0–100.0)
Monocytes Absolute: 0.5 10*3/uL (ref 0.1–1.0)
Monocytes Relative: 7.7 % (ref 3.0–12.0)
Neutro Abs: 3.3 10*3/uL (ref 1.4–7.7)
Neutrophils Relative %: 52.6 % (ref 43.0–77.0)
Platelets: 239 10*3/uL (ref 150.0–400.0)
RBC: 5.02 Mil/uL (ref 4.22–5.81)
RDW: 13.9 % (ref 11.5–15.5)
WBC: 6.3 10*3/uL (ref 4.0–10.5)

## 2018-08-31 LAB — BASIC METABOLIC PANEL
BUN: 13 mg/dL (ref 6–23)
CALCIUM: 10 mg/dL (ref 8.4–10.5)
CO2: 27 mEq/L (ref 19–32)
CREATININE: 1.74 mg/dL — AB (ref 0.40–1.50)
Chloride: 102 mEq/L (ref 96–112)
GFR: 55.25 mL/min — AB (ref >60.00)
Glucose, Bld: 85 mg/dL (ref 70–99)
POTASSIUM: 4.1 meq/L (ref 3.5–5.1)
Sodium: 138 mEq/L (ref 135–145)

## 2018-08-31 LAB — TSH: TSH: 1.41 u[IU]/mL (ref 0.35–4.50)

## 2018-08-31 MED ORDER — BUSPIRONE HCL 15 MG PO TABS: 15.0000 mg | ORAL_TABLET | Freq: Two times a day (BID) | ORAL | 3 refills | Status: DC

## 2018-08-31 MED ORDER — PROPRANOLOL HCL ER 60 MG PO CP24: 60.0000 mg | ORAL_CAPSULE | Freq: Every day | ORAL | 3 refills | Status: DC

## 2018-08-31 MED ORDER — SILDENAFIL CITRATE 100 MG PO TABS: 50.0000 mg | ORAL_TABLET | Freq: Every day | ORAL | 11 refills | Status: AC | PRN

## 2018-08-31 NOTE — Assessment & Plan Note (Signed)
Deteriorated.  Pt has gained weight.  Stressed need for healthy diet and regular exercise.  Check labs to risk stratify.  Will follow.

## 2018-08-31 NOTE — Assessment & Plan Note (Signed)
Deteriorated.  Pt is again stressing over 'almost everything'.  He feels this is impacting multiple aspects of his life.  Denies depression.  Will start Buspar and monitor for improvement.  Pt expressed understanding and is in agreement w/ plan.

## 2018-08-31 NOTE — Assessment & Plan Note (Signed)
Chronic problem.  BP is not ideal but it is adequate.  Given that he is struggling with his anxiety, will restart Propranolol in hopes of controlling both BP and anxiety.  Pt expressed understanding and is in agreement w/ plan.

## 2018-08-31 NOTE — Patient Instructions (Signed)
Follow up in 3-4 weeks to recheck anxiety RESTART the Propranolol once daily START the Buspirone twice daily.  Start w/ 1/2 tab twice daily and increase to 1 tab twice daily after 2 weeks if needed We'll call you with your Pulmonary referral to assess for sleep apnea It's ok to work out- it's encouraged!- but you have to ease into it Use the Sildenafil (Viagra) as needed.  Take at least 30-60 minutes before sexual activity (effects can last up to 24 hrs) Call with any questions or concerns Hang in there!

## 2018-08-31 NOTE — Progress Notes (Signed)
   Subjective:    Patient ID: Richard Hanna, male    DOB: 03-29-1976, 43 y.o.   MRN: [id number]  HPI HTN- chronic problem.  Pt has not been taking Losartan or Propranolol.  Pt has not been seen in >18 months b/c he lost his insurance.  No CP, SOB, HAs, edema.  Anxiety- pt reports anxiety levels have been higher, causing him to 'live in fear, like something's wrong'.  Pt feels anxiety is more frequent.  He is now having issues w/ ED which is worsens his anxiety  Obesity- pt has gained weight recently.  No reported snoring but pt is waking feeling poorly rested regardless of how much sleep he gets.  Just started [employer].   Review of Systems For ROS see HPI     Objective:   Physical Exam Vitals signs reviewed.  Constitutional:      General: He is not in acute distress.    Appearance: He is well-developed.  HENT:     Head: Normocephalic and atraumatic.  Eyes:     Conjunctiva/sclera: Conjunctivae normal.     Pupils: Pupils are equal, round, and reactive to light.  Neck:     Musculoskeletal: Normal range of motion and neck supple.     Thyroid: No thyromegaly.  Cardiovascular:     Rate and Rhythm: Normal rate and regular rhythm.     Heart sounds: Normal heart sounds. No murmur.  Pulmonary:     Effort: Pulmonary effort is normal. No respiratory distress.     Breath sounds: Normal breath sounds.  Abdominal:     General: Bowel sounds are normal. There is no distension.     Palpations: Abdomen is soft.  Lymphadenopathy:     Cervical: No cervical adenopathy.  Skin:    General: Skin is warm and dry.  Neurological:     Mental Status: He is alert and oriented to person, place, and time.     Cranial Nerves: No cranial nerve deficit.  Psychiatric:        Behavior: Behavior normal.           Assessment & Plan:

## 2018-08-31 NOTE — Assessment & Plan Note (Signed)
New.  Suspect some of this is anxiety driven.  Will start Viagra prn.  Pt expressed understanding and is in agreement w/ plan.

## 2018-08-31 NOTE — Assessment & Plan Note (Signed)
Pt never completed his home sleep study as recommended by Dr Vassie Loll.  Based on his excessive daytime sleepiness despite adequate hours of sleep, he will need to revisit the possibility of OSA.  Pulmonary referral placed.

## 2018-09-01 ENCOUNTER — Encounter: Payer: Self-pay | Admitting: Family Medicine

## 2018-09-01 ENCOUNTER — Other Ambulatory Visit: Payer: Self-pay | Admitting: Family Medicine

## 2018-09-01 DIAGNOSIS — R7989 Other specified abnormal findings of blood chemistry: Secondary | ICD-10-CM

## 2018-09-01 MED ORDER — OMEPRAZOLE 40 MG PO CPDR: 40.0000 mg | DELAYED_RELEASE_CAPSULE | Freq: Every day | ORAL | 1 refills | Status: DC

## 2018-09-01 MED FILL — busPIRone HCL 15 MG TABS: 15 | 30 days supply | Qty: 60 | Fill #0

## 2018-09-01 MED FILL — OMEPRAZOLE 40 MG CPDR: 40 | 30 days supply | Qty: 30 | Fill #0

## 2018-09-01 MED FILL — PROPRANOLOL ER 60 MG CAP: 60 | 30 days supply | Qty: 30 | Fill #0

## 2018-09-01 MED FILL — SILDENAFIL CITRATE 100 MG T: 100 | 30 days supply | Qty: 5 | Fill #0

## 2018-09-27 ENCOUNTER — Encounter: Payer: Self-pay | Admitting: Family Medicine

## 2018-09-27 ENCOUNTER — Ambulatory Visit (INDEPENDENT_AMBULATORY_CARE_PROVIDER_SITE_OTHER): Payer: BC Managed Care – PPO | Admitting: Family Medicine

## 2018-09-27 ENCOUNTER — Other Ambulatory Visit: Payer: Self-pay

## 2018-09-27 VITALS — BP 136/82 | HR 71 | Temp 98.8°F | Resp 16 | Ht 71.0 in | Wt 245.5 lb

## 2018-09-27 DIAGNOSIS — R7989 Other specified abnormal findings of blood chemistry: Secondary | ICD-10-CM | POA: Diagnosis not present

## 2018-09-27 DIAGNOSIS — F411 Generalized anxiety disorder: Secondary | ICD-10-CM | POA: Diagnosis not present

## 2018-09-27 DIAGNOSIS — K219 Gastro-esophageal reflux disease without esophagitis: Secondary | ICD-10-CM | POA: Diagnosis not present

## 2018-09-27 MED ORDER — ALPRAZOLAM 0.5 MG PO TABS: 0.5000 mg | ORAL_TABLET | Freq: Two times a day (BID) | ORAL | 1 refills | Status: DC | PRN

## 2018-09-27 MED FILL — ALPRAZolam 0.5 MG TABS: 0.5 | 15 days supply | Qty: 30 | Fill #0

## 2018-09-27 NOTE — Patient Instructions (Addendum)
Schedule your complete physical in 6 months We'll notify you of your lab results and make any changes if needed CONTINUE the Omeprazole for the acid reflux STOP the Buspar RESTART the Propranolol Use the Alprazolam as needed for high anxiety Call with any questions or concerns Hang in there!!!

## 2018-09-27 NOTE — Assessment & Plan Note (Signed)
Improved since starting Omeprazole daily.  Asymptomatic at this time.  Will follow.

## 2018-09-27 NOTE — Progress Notes (Signed)
   Subjective:    Patient ID: Richard Hanna, male    DOB: 1975/10/16, 43 y.o.   MRN: [id number]  HPI Anxiety- pt only took 2 or 3 of the Buspar and Propranolol.  'I didn't like how I felt'.  Wife encouraged him to continue but he stopped meds.  Pt reports anxiety is still present but 'not as intense'.  Not interested in daily medication.  Would like to resume Alprazolam prn.  Very difficult situation w/ step son- he has 2 babies w/ 2 different women, 3rd on the way.  Not working, living in the house.  Having people over during the day.  Pt is exhausted trying to monitor the situation.  GERD- improved since starting Prilosec daily  Elevated Cr- due for repeat labs.   Review of Systems For ROS see HPI     Objective:   Physical Exam Vitals signs reviewed.  Constitutional:      General: He is not in acute distress.    Appearance: Normal appearance. He is obese.  HENT:     Head: Normocephalic and atraumatic.  Skin:    General: Skin is warm and dry.  Neurological:     General: No focal deficit present.     Mental Status: He is alert and oriented to person, place, and time.  Psychiatric:        Mood and Affect: Mood normal.        Behavior: Behavior normal.        Thought Content: Thought content normal.           Assessment & Plan:  Elevated Cr- due for repeat labs.  Currently asymptomatic.

## 2018-09-27 NOTE — Assessment & Plan Note (Signed)
Pt is in a very difficult position at home.  His step son is causing serious problems but his wife is not willing to intervene.  This is causing problems between the 2 of them.  He is not interested in a daily medication but would like to use the Alprazolam as needed.  Not interested in sleep medicine.  Is willing to retry the Propranolol.  Will follow closely.

## 2018-09-28 ENCOUNTER — Encounter: Payer: Self-pay | Admitting: General Practice

## 2018-09-28 LAB — BASIC METABOLIC PANEL
BUN: 15 mg/dL (ref 6–23)
CHLORIDE: 106 meq/L (ref 96–112)
CO2: 28 mEq/L (ref 19–32)
Calcium: 9.3 mg/dL (ref 8.4–10.5)
Creatinine, Ser: 1.66 mg/dL — ABNORMAL HIGH (ref 0.40–1.50)
GFR: 54.87 mL/min — ABNORMAL LOW (ref >60.00)
Glucose, Bld: 81 mg/dL (ref 70–99)
Potassium: 4 mEq/L (ref 3.5–5.1)
Sodium: 141 mEq/L (ref 135–145)

## 2018-10-18 ENCOUNTER — Encounter: Payer: Self-pay | Admitting: Family Medicine

## 2018-10-23 ENCOUNTER — Ambulatory Visit (INDEPENDENT_AMBULATORY_CARE_PROVIDER_SITE_OTHER): Payer: BC Managed Care – PPO | Admitting: Family Medicine

## 2018-10-23 ENCOUNTER — Encounter: Payer: Self-pay | Admitting: Family Medicine

## 2018-10-23 ENCOUNTER — Encounter: Payer: Self-pay | Admitting: General Practice

## 2018-10-23 ENCOUNTER — Other Ambulatory Visit: Payer: Self-pay

## 2018-10-23 VITALS — BP 122/81 | HR 86 | Temp 98.1°F | Resp 16 | Ht 71.0 in | Wt 240.5 lb

## 2018-10-23 DIAGNOSIS — J309 Allergic rhinitis, unspecified: Secondary | ICD-10-CM

## 2018-10-23 MED ORDER — CETIRIZINE HCL 10 MG PO TABS: 10.0000 mg | ORAL_TABLET | Freq: Every day | ORAL | 11 refills | Status: DC

## 2018-10-23 MED ORDER — FLUTICASONE PROPIONATE 50 MCG/ACT NA SUSP: 2.0000 | Freq: Every day | NASAL | 6 refills | Status: DC

## 2018-10-23 MED ORDER — GUAIFENESIN-CODEINE 100-10 MG/5ML PO SYRP: 10.0000 mL | ORAL_SOLUTION | Freq: Three times a day (TID) | ORAL | 0 refills | Status: DC | PRN

## 2018-10-23 NOTE — Patient Instructions (Signed)
Follow up as needed or as scheduled START the once daily Zyrtec- I sent a prescription to [facility] b/c it's probably cheaper that way ADD the Flonase- 2 sprays each nostril daily Drink LOTS of fluids USE the cough syrup as needed to help w/ sleep (will cause drowsiness) Call with any questions or concerns Hang in there!

## 2018-10-23 NOTE — Progress Notes (Signed)
   Subjective:    Patient ID: Richard Hanna, male    DOB: [birth year], 43 y.o.   MRN: [id number]  HPI Cough- sxs started 3-4 weeks ago.  Has been taking OTC cough/cold products.  Cough is productive.  Keeping him awake at night.  + sick contacts.  No fevers.  + nasal congestion.  + HA. + sore throat.  No ear pain.   Review of Systems For ROS see HPI     Objective:   Physical Exam Vitals signs reviewed.  Constitutional:      General: He is not in acute distress.    Appearance: He is well-developed. He is obese.  HENT:     Head: Normocephalic and atraumatic.     Right Ear: Tympanic membrane normal.     Left Ear: Tympanic membrane normal.     Nose: Congestion and rhinorrhea present.     Mouth/Throat:     Pharynx: No oropharyngeal exudate or posterior oropharyngeal erythema.     Comments: Copious PND Eyes:     Conjunctiva/sclera: Conjunctivae normal.     Pupils: Pupils are equal, round, and reactive to light.  Neck:     Musculoskeletal: Normal range of motion and neck supple.  Cardiovascular:     Rate and Rhythm: Normal rate and regular rhythm.     Heart sounds: Normal heart sounds.  Pulmonary:     Effort: Pulmonary effort is normal. No respiratory distress.     Breath sounds: Normal breath sounds. No wheezing.  Lymphadenopathy:     Cervical: No cervical adenopathy.  Skin:    General: Skin is warm and dry.  Neurological:     Mental Status: He is alert.           Assessment & Plan:  Rhinitis- new.  Pt is not currently on allergy medication.  Will start.  No evidence of bacterial infxn so no need for abx.  Reviewed supportive care and red flags that should prompt return.  Pt expressed understanding and is in agreement w/ plan.

## 2018-12-17 MED FILL — ALPRAZolam 0.5 MG TABS: 0.5 | 15 days supply | Qty: 30 | Fill #1

## 2019-01-10 MED FILL — PROPRANOLOL HCL ER 60 MG CP: 60 | 30 days supply | Qty: 30 | Fill #1

## 2019-01-30 ENCOUNTER — Telehealth: Payer: Self-pay | Admitting: Family Medicine

## 2019-01-30 NOTE — Telephone Encounter (Signed)
LMOVM asking pt to CB to reschedule appt on 6/9.

## 2019-01-31 ENCOUNTER — Ambulatory Visit: Payer: Self-pay | Admitting: Family Medicine

## 2019-02-01 ENCOUNTER — Encounter: Payer: Self-pay | Admitting: Family Medicine

## 2019-02-01 ENCOUNTER — Other Ambulatory Visit: Payer: Self-pay

## 2019-02-01 ENCOUNTER — Ambulatory Visit (INDEPENDENT_AMBULATORY_CARE_PROVIDER_SITE_OTHER): Payer: BC Managed Care – PPO | Admitting: Family Medicine

## 2019-02-01 ENCOUNTER — Ambulatory Visit: Payer: Self-pay | Admitting: Family Medicine

## 2019-02-01 DIAGNOSIS — R0602 Shortness of breath: Secondary | ICD-10-CM

## 2019-02-01 MED ORDER — ALBUTEROL SULFATE HFA 108 (90 BASE) MCG/ACT IN AERS: 2.0000 | INHALATION_SPRAY | Freq: Four times a day (QID) | RESPIRATORY_TRACT | 2 refills | Status: DC | PRN

## 2019-02-01 MED FILL — ALBUTEROL SULFATE HFA 108 (: 108 (90 BAS | 25 days supply | Qty: 18 | Fill #0

## 2019-02-01 NOTE — Progress Notes (Signed)
Virtual Visit via Video   I connected with patient on 02/01/19 at  4:00 PM EDT by a video enabled telemedicine application and verified that I am speaking with the correct person using two identifiers.  Location patient: Home Location provider: Acupuncturist, Office Persons participating in the virtual visit: Patient, Provider, [place] (Jess B)  I discussed the limitations of evaluation and management by telemedicine and the availability of in person appointments. The patient expressed understanding and agreed to proceed.  Subjective:   HPI:   SOB- pt reports 'SOB w/ the smallest of activities'.  Pt reports he is able to exercise w/o a problem.  For last 2 weeks has had increased SOB w/ exercise.  Restarted allergy medication.  Has expiratory wheezes.  Pt has hx of childhood and exercise induced asthma.  Has ever used inhaler previously.  Pt reports this is not similar to panic attacks.  No fevers- gets temp checked daily.  No coughing.  Last BP 135/90 something.  Denies CP.  Had to stop mowing grass due to SOB.  Had SOB after spraying for ants.  ROS:   See pertinent positives and negatives per HPI.  Patient Active Problem List   Diagnosis Date Noted  . Obesity (BMI 30.0-34.9) 08/31/2018  . ED (erectile dysfunction) 08/31/2018  . OSA (obstructive sleep apnea) 12/22/2016  . Essential hypertension 06/02/2016  . Toenail fungus 02/04/2016  . Generalized anxiety disorder 03/06/2013  . Allergic rhinitis 10/14/2010  . G E R D 09/22/2010  . HEADACHE, CHRONIC 09/22/2010  . Asthma 08/28/2010    Social History   Tobacco Use  . Smoking status: Former Smoker    Packs/day: 1.00    Years: 6.00    Pack years: 6.00    Types: Cigarettes    Quit date: 02/28/2017    Years since quitting: 1.9  . Smokeless tobacco: Never Used  Substance Use Topics  . Alcohol use: Yes    Current Outpatient Medications:  .  ALPRAZolam (XANAX) 0.5 MG tablet, Take 1 tablet (0.5 mg total) by mouth 2 (two)  times daily as needed for anxiety., Disp: 30 tablet, Rfl: 1 .  cetirizine (ZYRTEC) 10 MG tablet, Take 1 tablet (10 mg total) by mouth daily., Disp: 30 tablet, Rfl: 11 .  fluticasone (FLONASE) 50 MCG/ACT nasal spray, Place 2 sprays into both nostrils daily., Disp: 16 g, Rfl: 6 .  guaiFENesin-codeine (ROBITUSSIN AC) 100-10 MG/5ML syrup, Take 10 mLs by mouth 3 (three) times daily as needed for cough., Disp: 120 mL, Rfl: 0 .  omeprazole (PRILOSEC) 40 MG capsule, Take 1 capsule (40 mg total) by mouth daily., Disp: 90 capsule, Rfl: 1 .  propranolol ER (INDERAL LA) 60 MG 24 hr capsule, Take 1 capsule (60 mg total) by mouth daily., Disp: 30 capsule, Rfl: 3 .  sildenafil (VIAGRA) 100 MG tablet, Take 0.5-1 tablets (50-100 mg total) by mouth daily as needed for erectile dysfunction., Disp: 5 tablet, Rfl: 11  No Known Allergies  Objective:   There were no vitals taken for this visit.  AAOx3, NAD NCAT, EOMI No obvious CN deficits Coloring WNL Pt is able to speak clearly, coherently without shortness of breath or increased work of breathing.  Thought process is linear.  Mood is appropriate.   Assessment and Plan:   SOB- new.  Pt is able to exercise w/o difficulty but will have SOB w/ minimal exertion (stairs, excessive talking) at other times during the day.  He notes that the SOB seems to occur w/  airway irritation- mowing grass, spraying for ants, disinfectants.  Has hx of childhood asthma.  Has never required inhaler.  Denies cardiac sxs.  Will start albuterol PRN and pt to log symptoms until he returns on Monday and I can listen to his lungs, do labs, and EKG if needed.  Pt is aware that if SOB worsens he is to go to ER for evaluation.  Pt expressed understanding and is in agreement w/ plan.    Neena RhymesKatherine Zoelle Markus, MD 02/01/2019

## 2019-02-01 NOTE — Progress Notes (Signed)
I have discussed the procedure for the virtual visit with the patient who has given consent to proceed with assessment and treatment.   Unable to get vitals. Has recently stopped allergy meds. Pt states he feels winded even after sleeping.   Davis Gourd, CMA

## 2019-02-05 ENCOUNTER — Ambulatory Visit: Payer: Self-pay | Admitting: Family Medicine

## 2019-02-07 ENCOUNTER — Encounter: Payer: Self-pay | Admitting: Family Medicine

## 2019-02-07 ENCOUNTER — Other Ambulatory Visit: Payer: Self-pay

## 2019-02-07 ENCOUNTER — Ambulatory Visit (INDEPENDENT_AMBULATORY_CARE_PROVIDER_SITE_OTHER): Payer: BC Managed Care – PPO | Admitting: Family Medicine

## 2019-02-07 VITALS — BP 124/80 | HR 64 | Temp 98.1°F | Resp 16 | Ht 71.0 in | Wt 237.2 lb

## 2019-02-07 DIAGNOSIS — R0609 Other forms of dyspnea: Secondary | ICD-10-CM | POA: Diagnosis not present

## 2019-02-07 DIAGNOSIS — I10 Essential (primary) hypertension: Secondary | ICD-10-CM

## 2019-02-07 DIAGNOSIS — R7989 Other specified abnormal findings of blood chemistry: Secondary | ICD-10-CM | POA: Diagnosis not present

## 2019-02-07 NOTE — Progress Notes (Signed)
   Subjective:    Patient ID: Richard Hanna, male    DOB: 1975/11/13, 43 y.o.   MRN: [id number]  HPI SOB on exertion- pt reports he got winded walking from parking lot into office.  'sometimes i'll be fine and sometimes I feel like I can't catch my breath'.  Stopped Propranolol and BP remained well controlled.  Pt was exercising 3-4x/week w/o difficulty when off propranolol.  Restarted propranolol due to 1 elevated BP reading and since then has had intermittent SOB.   Review of Systems For ROS see HPI     Objective:   Physical Exam Vitals signs reviewed.  Constitutional:      General: He is not in acute distress.    Appearance: He is well-developed.  HENT:     Head: Normocephalic and atraumatic.  Eyes:     Conjunctiva/sclera: Conjunctivae normal.     Pupils: Pupils are equal, round, and reactive to light.  Neck:     Musculoskeletal: Normal range of motion and neck supple.     Thyroid: No thyromegaly.  Cardiovascular:     Rate and Rhythm: Normal rate and regular rhythm.     Heart sounds: Normal heart sounds. No murmur.  Pulmonary:     Effort: Pulmonary effort is normal. No respiratory distress.     Breath sounds: Normal breath sounds.  Abdominal:     General: Bowel sounds are normal. There is no distension.     Palpations: Abdomen is soft.  Lymphadenopathy:     Cervical: No cervical adenopathy.  Skin:    General: Skin is warm and dry.  Neurological:     Mental Status: He is alert and oriented to person, place, and time.     Cranial Nerves: No cranial nerve deficit.  Psychiatric:        Behavior: Behavior normal.           Assessment & Plan:  SOB w/ exertion- new.  Pt reports that when he was off his Propranolol he was able to exercise w/o difficulty.  Since restarting Propranolol he has had intermittent SOB w/ minimal exertion.  EKG WNL.  Check labs to r/o metabolic cause.  STOP Propranolol and monitor for improvement in sxs.  If no improvement or worsening, will  need pulmonary referral.  Pt expressed understanding and is in agreement w/ plan.

## 2019-02-07 NOTE — Patient Instructions (Signed)
Follow up by phone/MyChart next week and let me know how you are feeling We'll notify you of your lab results and make any changes if needed STOP the Propranolol EKG looks good! Call with any questions or concerns Hang in there!!

## 2019-02-08 LAB — CBC WITH DIFFERENTIAL/PLATELET
Absolute Monocytes: 533 cells/uL (ref 200–950)
Basophils Absolute: 59 cells/uL (ref 0–200)
Basophils Relative: 0.9 %
Eosinophils Absolute: 260 cells/uL (ref 15–500)
Eosinophils Relative: 4 %
HCT: 42.3 % (ref 38.5–50.0)
Hemoglobin: 14.7 g/dL (ref 13.2–17.1)
Lymphs Abs: 2275 cells/uL (ref 850–3900)
MCH: 30.5 pg (ref 27.0–33.0)
MCHC: 34.8 g/dL (ref 32.0–36.0)
MCV: 87.8 fL (ref 80.0–100.0)
MPV: 11.7 fL (ref 7.5–12.5)
Monocytes Relative: 8.2 %
Neutro Abs: 3374 cells/uL (ref 1500–7800)
Neutrophils Relative %: 51.9 %
Platelets: 212 10*3/uL (ref 140–400)
RBC: 4.82 10*6/uL (ref 4.20–5.80)
RDW: 13.5 % (ref 11.0–15.0)
Total Lymphocyte: 35 %
WBC: 6.5 10*3/uL (ref 3.8–10.8)

## 2019-02-08 LAB — BASIC METABOLIC PANEL
BUN/Creatinine Ratio: 16 (calc) (ref 6–22)
BUN: 22 mg/dL (ref 7–25)
CO2: 26 mmol/L (ref 20–32)
Calcium: 9.8 mg/dL (ref 8.6–10.3)
Chloride: 105 mmol/L (ref 98–110)
Creat: 1.39 mg/dL — ABNORMAL HIGH (ref 0.60–1.35)
Glucose, Bld: 64 mg/dL — ABNORMAL LOW (ref 65–99)
Potassium: 4.1 mmol/L (ref 3.5–5.3)
Sodium: 141 mmol/L (ref 135–146)

## 2019-02-08 LAB — TSH: TSH: 1.07 mIU/L (ref 0.40–4.50)

## 2019-02-23 MED FILL — SILDENAFIL CITRATE 100 MG T: 100 | 30 days supply | Qty: 5 | Fill #1

## 2019-03-06 ENCOUNTER — Ambulatory Visit (INDEPENDENT_AMBULATORY_CARE_PROVIDER_SITE_OTHER): Payer: BC Managed Care – PPO | Admitting: Family Medicine

## 2019-03-06 ENCOUNTER — Other Ambulatory Visit: Payer: Self-pay

## 2019-03-06 ENCOUNTER — Encounter: Payer: Self-pay | Admitting: Family Medicine

## 2019-03-06 ENCOUNTER — Telehealth: Payer: Self-pay | Admitting: General Practice

## 2019-03-06 VITALS — BP 142/93 | HR 70 | Temp 97.9°F | Ht 71.0 in | Wt 233.0 lb

## 2019-03-06 DIAGNOSIS — Z20828 Contact with and (suspected) exposure to other viral communicable diseases: Secondary | ICD-10-CM

## 2019-03-06 DIAGNOSIS — Z20822 Contact with and (suspected) exposure to covid-19: Secondary | ICD-10-CM

## 2019-03-06 NOTE — Telephone Encounter (Signed)
lvm for pt at [phone] to return call to scheduled covid testing.  Pt was referred by: Midge Minium, MD

## 2019-03-06 NOTE — Telephone Encounter (Signed)
Phone call to pt.  Scheduled for COVID testing on 03/07/19 @ 11:15 AM, at the [facility].  Advised of testing protocol.  Verb. Understanding.  Orders placed.     COVID Testing Received: Today Message Contents  Davis Gourd, CMA  [facility] [employer]        MRN: [id number]  Pt Name: Richard Hanna  DOB:12-Nov-1975  Reason for Test: Recent Exposure to sister who had positive COVID test  Insurance: [employer] #: [id number]  Eligibility: Valid

## 2019-03-06 NOTE — Progress Notes (Signed)
I have discussed the procedure for the virtual visit with the patient who has given consent to proceed with assessment and treatment.   Jessica L Brodmerkel, CMA     

## 2019-03-06 NOTE — Progress Notes (Signed)
   Virtual Visit via Video   I connected with patient on 03/06/19 at 11:30 AM EDT by a video enabled telemedicine application and verified that I am speaking with the correct person using two identifiers.  Location patient: Home Location provider: Acupuncturist, Office Persons participating in the virtual visit: Patient, Provider, [place] (Jess B)  I discussed the limitations of evaluation and management by telemedicine and the availability of in person appointments. The patient expressed understanding and agreed to proceed.  Subjective:   HPI:   COVID exposure- sister called on Sunday and told him she was COVID +.  He was spending quite a bit of time around her w/o mask on.  Sister had increased cough, SOB but no fever. Now wife is couging.  Pt is having mild nasal congestion.  Pt has had some SOB.  Mild cough last night.  ROS:   See pertinent positives and negatives per HPI.  Patient Active Problem List   Diagnosis Date Noted  . Obesity (BMI 30.0-34.9) 08/31/2018  . ED (erectile dysfunction) 08/31/2018  . OSA (obstructive sleep apnea) 12/22/2016  . Essential hypertension 06/02/2016  . Toenail fungus 02/04/2016  . Generalized anxiety disorder 03/06/2013  . Allergic rhinitis 10/14/2010  . G E R D 09/22/2010  . HEADACHE, CHRONIC 09/22/2010  . Asthma 08/28/2010    Social History   Tobacco Use  . Smoking status: Former Smoker    Packs/day: 1.00    Years: 6.00    Pack years: 6.00    Types: Cigarettes    Quit date: 02/28/2017    Years since quitting: 2.0  . Smokeless tobacco: Never Used  Substance Use Topics  . Alcohol use: Yes    Current Outpatient Medications:  .  albuterol (VENTOLIN HFA) 108 (90 Base) MCG/ACT inhaler, Inhale 2 puffs into the lungs every 6 (six) hours as needed for wheezing or shortness of breath., Disp: 1 Inhaler, Rfl: 2 .  ALPRAZolam (XANAX) 0.5 MG tablet, Take 1 tablet (0.5 mg total) by mouth 2 (two) times daily as needed for anxiety., Disp: 30  tablet, Rfl: 1 .  cetirizine (ZYRTEC) 10 MG tablet, Take 1 tablet (10 mg total) by mouth daily., Disp: 30 tablet, Rfl: 11 .  fluticasone (FLONASE) 50 MCG/ACT nasal spray, Place 2 sprays into both nostrils daily., Disp: 16 g, Rfl: 6 .  omeprazole (PRILOSEC) 40 MG capsule, Take 1 capsule (40 mg total) by mouth daily., Disp: 90 capsule, Rfl: 1 .  sildenafil (VIAGRA) 100 MG tablet, Take 0.5-1 tablets (50-100 mg total) by mouth daily as needed for erectile dysfunction., Disp: 5 tablet, Rfl: 11  No Known Allergies  Objective:   BP (!) 142/93   Pulse 70   Temp 97.9 F (36.6 C) (Oral)   Ht 5\' 11"  (1.803 m)   Wt 233 lb (105.7 kg)   SpO2 98%   BMI 32.50 kg/m  AAOx3, NAD NCAT, EOMI No obvious CN deficits Coloring WNL Pt is able to speak clearly, coherently without shortness of breath or increased work of breathing.  Thought process is linear.  Mood is appropriate.   Assessment and Plan:   COVID exposure- sister is + for COVID and they have spent considerable time together.  Wife is now coughing.  Pt is asymptomatic w/ exception of nasal congestion and intermittent SOB.  Reviewed sxs to look for- pt is aware.  Pt will be sent for testing and do home monitoring program.   Annye Asa, MD 03/06/2019

## 2019-03-07 ENCOUNTER — Other Ambulatory Visit: Payer: Self-pay | Admitting: Internal Medicine

## 2019-03-07 ENCOUNTER — Other Ambulatory Visit: Payer: BC Managed Care – PPO

## 2019-03-12 ENCOUNTER — Encounter: Payer: Self-pay | Admitting: Family Medicine

## 2019-03-12 MED ORDER — OMEPRAZOLE 40 MG PO CPDR: 40.0000 mg | DELAYED_RELEASE_CAPSULE | Freq: Every day | ORAL | 1 refills | Status: DC

## 2019-03-12 MED FILL — OMEPRAZOLE 40 MG CPDR: 40 | 30 days supply | Qty: 30 | Fill #0

## 2019-03-15 ENCOUNTER — Encounter: Payer: Self-pay | Admitting: Family Medicine

## 2019-03-15 DIAGNOSIS — S76111A Strain of right quadriceps muscle, fascia and tendon, initial encounter: Secondary | ICD-10-CM

## 2019-03-19 ENCOUNTER — Ambulatory Visit: Payer: Self-pay | Admitting: Family Medicine

## 2019-03-19 NOTE — Telephone Encounter (Signed)
TN called lab and was informed results were released to [facility] on 03/09/2019.   Three attempts to reach pt.  TN called [facility]. Instructed to have pt call for results.  Left message on pts VM with [facility] number.

## 2019-03-26 ENCOUNTER — Ambulatory Visit: Payer: BC Managed Care – PPO | Admitting: Family Medicine

## 2019-03-28 ENCOUNTER — Ambulatory Visit: Payer: Self-pay

## 2019-03-28 ENCOUNTER — Ambulatory Visit (INDEPENDENT_AMBULATORY_CARE_PROVIDER_SITE_OTHER): Payer: BC Managed Care – PPO | Admitting: Family Medicine

## 2019-03-28 ENCOUNTER — Other Ambulatory Visit: Payer: Self-pay

## 2019-03-28 VITALS — BP 130/84 | Ht 71.0 in | Wt 240.0 lb

## 2019-03-28 DIAGNOSIS — S76312A Strain of muscle, fascia and tendon of the posterior muscle group at thigh level, left thigh, initial encounter: Secondary | ICD-10-CM

## 2019-03-28 DIAGNOSIS — M25562 Pain in left knee: Secondary | ICD-10-CM | POA: Diagnosis not present

## 2019-03-28 NOTE — Patient Instructions (Signed)
You have a low Grade 2 hamstring strain. Wear ACE wrap when up and walking around for next 2-4 weeks to help with swelling. Tylenol, aleve only if needed. Ice or heat 15 minutes at a time 3-4 times a day (whichever feels better at this point - ice was important the first 3 days). Leg curls, hamstring swings, running lunges - add 2 pound weight with time if these are too easy. 3 sets of 10 once or twice a day.  See handout for additional exercises to do at home. Consider physical therapy as well. Follow up with me in 4 weeks. Risk of reinjury is highest in first 6 weeks from injury - I'd avoid any running, sprinting, jumping for the next 4 weeks.

## 2019-03-28 NOTE — Progress Notes (Signed)
PCP and consultation requested by: Sheliah Hatchabori, Katherine E, MD  Subjective:  CC: left leg pain  HPI: Patient is a 43 y.o. male here for left leg pain.  Number patient states that 2-1/2 weeks ago he was running with his kid when he suddenly felt a sharp "pop" in the back of his right thigh.  He took Tylenol and placed ice on his thigh to help alleviate pain, then soon after the pain started to shift into his right knee and calf muscle.  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  The nagging pain is constant throughout the day but sitting and placing pressure on the back of the right thigh produces the most pain.  The pain is not so severe that it prevents him from doing his job.  The patient states he is also had extensive bruising, mild swelling from the back of the right thigh down into the right calf.  Past Medical History:  Diagnosis Date  . Asthma   . GERD (gastroesophageal reflux disease)   . Headache(784.0)   . Panic attacks     Current Outpatient Medications on File Prior to Visit  Medication Sig Dispense Refill  . albuterol (VENTOLIN HFA) 108 (90 Base) MCG/ACT inhaler Inhale 2 puffs into the lungs every 6 (six) hours as needed for wheezing or shortness of breath. 1 Inhaler 2  . ALPRAZolam (XANAX) 0.5 MG tablet Take 1 tablet (0.5 mg total) by mouth 2 (two) times daily as needed for anxiety. 30 tablet 1  . cetirizine (ZYRTEC) 10 MG tablet Take 1 tablet (10 mg total) by mouth daily. 30 tablet 11  . fluticasone (FLONASE) 50 MCG/ACT nasal spray Place 2 sprays into both nostrils daily. 16 g 6  . omeprazole (PRILOSEC) 40 MG capsule Take 1 capsule (40 mg total) by mouth daily. 90 capsule 1  . sildenafil (VIAGRA) 100 MG tablet Take 0.5-1 tablets (50-100 mg total) by mouth daily as needed for erectile dysfunction. 5 tablet 11   No current facility-administered medications on file prior to visit.     Past Surgical History:  Procedure Laterality Date   . GANGLION CYST EXCISION     L wrist    No Known Allergies  Social History   Socioeconomic History  . Marital status: Married    Spouse name: Not on file  . Number of children: Not on file  . Years of education: Not on file  . Highest education level: Not on file  Occupational History  . Not on file  Social Needs  . Financial resource strain: Not on file  . Food insecurity    Worry: Not on file    Inability: Not on file  . Transportation needs    Medical: Not on file    Non-medical: Not on file  Tobacco Use  . Smoking status: Former Smoker    Packs/day: 1.00    Years: 6.00    Pack years: 6.00    Types: Cigarettes    Quit date: 02/28/2017    Years since quitting: 2.0  . Smokeless tobacco: Never Used  Substance and Sexual Activity  . Alcohol use: Yes  . Drug use: No  . Sexual activity: Yes  Lifestyle  . Physical activity    Days per week: Not on file    Minutes per session: Not on file  . Stress: Not on file  Relationships  . Social Musicianconnections    Talks on phone:  Not on file    Gets together: Not on file    Attends religious service: Not on file    Active member of club or organization: Not on file    Attends meetings of clubs or organizations: Not on file    Relationship status: Not on file  . Intimate partner violence    Fear of current or ex partner: Not on file    Emotionally abused: Not on file    Physically abused: Not on file    Forced sexual activity: Not on file  Other Topics Concern  . Not on file  Social History Narrative  . Not on file    Family History  Problem Relation Age of Onset  . Hypertension Mother   . Hypertension Father   . Diabetes Father   . Diabetes Paternal Grandfather    BP 130/84   Ht 5\' 11"  (1.803 m)   Wt 240 lb (108.9 kg)   BMI 33.47 kg/m   Review of Systems: See HPI above.     Objective:  Physical Exam:  Gen: NAD, comfortable in exam room Respiratory: Normal work of breathing Right lower extremity: no  palpable defect of hamstring muscles or tendons. Ecchymosis appreciated to posterior thigh and proximal posterior/lateral calf, mild tenderness to palpation throughout region; FROM.  negative Apley, negative Valgus/Varus strain, Negative McMurray to right knee Neuro: 5/5 strength with left and right knee extension and flexion, pain elicited with right knee flexion and strength testing  Left knee: No deformity. FROM with 5/5 strength. No tenderness to palpation. NVI distally.  Limited diagnostic Ultrasound of Right Hamstring:  Findings: biceps femoris was viewed in both long and short axis. There is a small hypoechoic region in the distal biceps femoris consistent with partial tear. Semitendinosus and semimebranosus were viewed in short and long axis, no evidence of tearing.  Impression: US findings consistent with small partial tear of right biceps femoris muscle.   Assessment & Plan:  1. Right Biceps femoris, grade 2 partial tear:  -Wear Ace wrap when active for the next 2-4 weeks -Tylenol and Aleve as needed -Ice or heat compress 15 minutes 3-4 times daily -Patient given handout with exercises: Leg curls, hamstring swings, running lunges; can also perform exercises using 2 pound weights.  Patient should perform exercises 3 sets of 10 twice daily -Avoid running, sprinting and jumping for the next 4 weeks to reduce reinjury -Considering physical therapy  Patient to follow-up in 4 weeks  Milus Banister, [place], PGY-2 03/28/2019 4:54 PM

## 2019-03-29 ENCOUNTER — Encounter: Payer: Self-pay | Admitting: Family Medicine

## 2019-03-30 ENCOUNTER — Inpatient Hospital Stay: Payer: BLUE CROSS/BLUE SHIELD | Admitting: Family Medicine

## 2019-04-24 MED FILL — OMEPRAZOLE 40 MG CPDR: 40 | 30 days supply | Qty: 30 | Fill #1

## 2019-04-27 ENCOUNTER — Encounter: Payer: Self-pay | Admitting: Family Medicine

## 2019-04-27 MED ORDER — ALPRAZOLAM 0.5 MG PO TABS: 0.5000 mg | ORAL_TABLET | Freq: Two times a day (BID) | ORAL | 1 refills | Status: DC | PRN

## 2019-04-27 MED FILL — ALPRAZolam 0.5 MG TABS: 0.5 | 15 days supply | Qty: 30 | Fill #0

## 2019-04-27 NOTE — Telephone Encounter (Signed)
Patient last app 02/07/2019 No follow up  Last script 09/27/18 #30 with 1 rf

## 2019-05-06 ENCOUNTER — Encounter: Payer: Self-pay | Admitting: Family Medicine

## 2019-05-16 ENCOUNTER — Encounter: Payer: Self-pay | Admitting: Family Medicine

## 2019-05-16 ENCOUNTER — Other Ambulatory Visit: Payer: Self-pay

## 2019-05-16 ENCOUNTER — Other Ambulatory Visit: Payer: BC Managed Care – PPO

## 2019-05-16 ENCOUNTER — Ambulatory Visit (INDEPENDENT_AMBULATORY_CARE_PROVIDER_SITE_OTHER): Payer: BC Managed Care – PPO | Admitting: Family Medicine

## 2019-05-16 ENCOUNTER — Telehealth: Payer: Self-pay | Admitting: *Deleted

## 2019-05-16 ENCOUNTER — Ambulatory Visit (INDEPENDENT_AMBULATORY_CARE_PROVIDER_SITE_OTHER): Payer: BC Managed Care – PPO

## 2019-05-16 VITALS — BP 130/81 | HR 72 | Temp 98.1°F | Resp 16 | Ht 71.0 in | Wt 247.2 lb

## 2019-05-16 DIAGNOSIS — R0602 Shortness of breath: Secondary | ICD-10-CM | POA: Diagnosis not present

## 2019-05-16 DIAGNOSIS — I10 Essential (primary) hypertension: Secondary | ICD-10-CM | POA: Diagnosis not present

## 2019-05-16 DIAGNOSIS — M546 Pain in thoracic spine: Secondary | ICD-10-CM

## 2019-05-16 DIAGNOSIS — E669 Obesity, unspecified: Secondary | ICD-10-CM | POA: Diagnosis not present

## 2019-05-16 DIAGNOSIS — E66811 Obesity, class 1: Secondary | ICD-10-CM

## 2019-05-16 MED ORDER — PREDNISONE 10 MG PO TABS: ORAL_TABLET | ORAL | 0 refills | Status: DC

## 2019-05-16 MED ORDER — QVAR REDIHALER 80 MCG/ACT IN AERB: 1.0000 | INHALATION_SPRAY | Freq: Two times a day (BID) | RESPIRATORY_TRACT | 1 refills | Status: DC

## 2019-05-16 MED ORDER — CYCLOBENZAPRINE HCL 10 MG PO TABS: 10.0000 mg | ORAL_TABLET | Freq: Three times a day (TID) | ORAL | 0 refills | Status: DC | PRN

## 2019-05-16 MED FILL — CYCLOBENZAPRINE HCL 10 MG T: 10 | 10 days supply | Qty: 30 | Fill #0

## 2019-05-16 MED FILL — predniSONE 10 MG TABS: 10 | 9 days supply | Qty: 18 | Fill #0

## 2019-05-16 NOTE — Progress Notes (Signed)
   Subjective:    Patient ID: Richard Hanna, male    DOB: [birth year], 43 y.o.   MRN: [id number]  HPI SOB- pt reports more frequent episodes, 'I just SOB just walking up the steps'.  Pt never got COVID test result, 'was lost at [facility]'.  Pt reports he will be SOB with normal daily activities.  No relief w/ albuterol.  Denies increased anxiety.    HTN- chronic problem.  Stopped atenolol due to SOB.  BP remains adequately controlled.    Obesity- pt has gained 7 lbs since last visit.    Back pain- was lifting a pt on Monday and felt a 'sharp pain'.  Was unable to stand up straight.  Taking Ibuprofen 'round the clock' with intermittent relief.  Will feel like things are getting better but then will have severe, sharp pain again w/ certain movements.  Pain is mid-upper back.  Pt reports pain will radiate into L shoulder when it occurs.  'it's like a really really bad spasm'.     Review of Systems For ROS see HPI     Objective:   Physical Exam Vitals signs reviewed.  Constitutional:      General: He is not in acute distress.    Appearance: He is well-developed.  HENT:     Head: Normocephalic and atraumatic.  Eyes:     Conjunctiva/sclera: Conjunctivae normal.     Pupils: Pupils are equal, round, and reactive to light.  Neck:     Musculoskeletal: Normal range of motion and neck supple.     Thyroid: No thyromegaly.  Cardiovascular:     Rate and Rhythm: Normal rate and regular rhythm.     Heart sounds: Normal heart sounds. No murmur.  Pulmonary:     Effort: Pulmonary effort is normal. No respiratory distress.     Breath sounds: Normal breath sounds. No decreased breath sounds, wheezing or rhonchi.  Abdominal:     General: Bowel sounds are normal. There is no distension.     Palpations: Abdomen is soft.  Musculoskeletal:     Comments: + TTP over thoracic paraspinal muscles  Lymphadenopathy:     Cervical: No cervical adenopathy.  Skin:    General: Skin is warm and dry.   Neurological:     Mental Status: He is alert and oriented to person, place, and time.     Cranial Nerves: No cranial nerve deficit.  Psychiatric:        Behavior: Behavior normal.           Assessment & Plan:  SOB- ongoing issue for pt.  Has hx of asthma but is not on daily controller medication.  Start BID Qvar.  Will get CXR to assess for vascular congestion vs hyperinflation vs other.  Will also monitor if pt's SOB improves w/ Prednisone taper he will take for his back.  At this point, we need to determine if his SOB is pulmonary vs cardiac.  Acute thoracic back pain- new.  Occurred in setting of lifting a pt.  When back spasms, he is unable to stand up straight.  Will start Prednisone taper and Flexeril prn.  At this time pt is not able to work but will hopefully be able to return next week.

## 2019-05-16 NOTE — Assessment & Plan Note (Signed)
Deteriorated.  Pt has gained 7 lbs since last visit.  Encouraged healthy diet and regular exercise.  Will follow.

## 2019-05-16 NOTE — Patient Instructions (Signed)
Go to our [place] office at [place] for your chest xray START the Qvar inhaler- 1 puff twice daily STOP the ibuprofen START the Prednisone- 3 pills at the same time x3 days, then 2 pills at the same time x3 days, and then 1 pill daily.  Take w/ food USE the cyclobenzaprine nightly for muscle spasm.  You can use during the day as long as it doesn't cause excessive drowsiness HEAT the back Call with any questions or concerns Hang in there!!

## 2019-05-16 NOTE — Telephone Encounter (Signed)
Please advise,

## 2019-05-16 NOTE — Telephone Encounter (Signed)
I am not a workman's comp doctor.  I can only provide documentation as to what pt told me.  At this time, I do not know when it is safe to return to work b/c we just saw him today and he hasn't had a chance to start his treatment yet.

## 2019-05-16 NOTE — Assessment & Plan Note (Signed)
Ongoing issue.  Adequate BP control today w/o beta blocker.  Will continue to follow.

## 2019-05-16 NOTE — Telephone Encounter (Signed)
Patient called in and said that he was in earlier and was seen for back pain.  He states that this back pain was actually a worker's comp issues and his HR director told him that since he had already come here for the pain then he would be fine to continue to see Dr. Birdie Riddle.  He states they need to know what his status is as far as returning to work.  Since he is still having back pain they need to know if he is safe to return to work, documentation as to what happened.

## 2019-05-17 ENCOUNTER — Telehealth: Payer: Self-pay | Admitting: General Practice

## 2019-05-17 NOTE — Telephone Encounter (Signed)
Called pt and left a detailed message to advise PCP advice. Also advised that if he needs a workers comp doctor to complete any paperwork Dr. Birdie Riddle could/would not because she does not handle any workers comp cases.

## 2019-05-17 NOTE — Telephone Encounter (Signed)
Received notice that PA for QVAR redihaler was denied. Per insurance pt has to have tried and failed two of the following:   Arnuity Ellipta Flovent Diskus Flovent HFA  Or   Pulmicort Flexhaler

## 2019-05-18 ENCOUNTER — Other Ambulatory Visit: Payer: Self-pay | Admitting: Family Medicine

## 2019-05-18 DIAGNOSIS — R0602 Shortness of breath: Secondary | ICD-10-CM

## 2019-05-18 MED ORDER — FLOVENT HFA 110 MCG/ACT IN AERO: 2.0000 | INHALATION_SPRAY | Freq: Two times a day (BID) | RESPIRATORY_TRACT | 3 refills | Status: DC

## 2019-05-18 MED FILL — FLOVENT HFA 110 MCG INHALER: 110 | 30 days supply | Qty: 12 | Fill #0

## 2019-05-18 NOTE — Addendum Note (Signed)
Addended by: Davis Gourd on: 05/18/2019 08:29 AM   Modules accepted: Orders

## 2019-05-18 NOTE — Telephone Encounter (Signed)
Flovent 110 2 puffs BID

## 2019-05-18 NOTE — Telephone Encounter (Signed)
New rx sent to the pharmacy

## 2019-05-28 ENCOUNTER — Encounter: Payer: Self-pay | Admitting: Family Medicine

## 2019-05-28 MED ORDER — TERBINAFINE HCL 250 MG PO TABS: 250.0000 mg | ORAL_TABLET | Freq: Every day | ORAL | 0 refills | Status: DC

## 2019-05-28 MED FILL — TERBINAFINE HCL 250 MG TAB: 250 | 30 days supply | Qty: 30 | Fill #0

## 2019-06-06 MED FILL — OMEPRAZOLE 40 MG CPDR: 40 | 30 days supply | Qty: 30 | Fill #2

## 2019-06-07 ENCOUNTER — Ambulatory Visit (INDEPENDENT_AMBULATORY_CARE_PROVIDER_SITE_OTHER): Payer: BC Managed Care – PPO | Admitting: Pulmonary Disease

## 2019-06-07 ENCOUNTER — Other Ambulatory Visit: Payer: Self-pay

## 2019-06-07 ENCOUNTER — Encounter: Payer: Self-pay | Admitting: Pulmonary Disease

## 2019-06-07 VITALS — BP 136/100 | HR 67 | Ht 71.0 in | Wt 248.0 lb

## 2019-06-07 DIAGNOSIS — G4700 Insomnia, unspecified: Secondary | ICD-10-CM

## 2019-06-07 DIAGNOSIS — G4730 Sleep apnea, unspecified: Secondary | ICD-10-CM | POA: Diagnosis not present

## 2019-06-07 DIAGNOSIS — R0683 Snoring: Secondary | ICD-10-CM

## 2019-06-07 DIAGNOSIS — E6609 Other obesity due to excess calories: Secondary | ICD-10-CM

## 2019-06-07 DIAGNOSIS — R0602 Shortness of breath: Secondary | ICD-10-CM

## 2019-06-07 DIAGNOSIS — Z6834 Body mass index (BMI) 34.0-34.9, adult: Secondary | ICD-10-CM

## 2019-06-07 MED ORDER — BUDESONIDE-FORMOTEROL FUMARATE 80-4.5 MCG/ACT IN AERO: 2.0000 | INHALATION_SPRAY | Freq: Two times a day (BID) | RESPIRATORY_TRACT | 0 refills | Status: DC

## 2019-06-07 NOTE — Progress Notes (Signed)
   Subjective:    Patient ID: Richard Hanna, male    DOB: 1975-11-25, 43 y.o.   MRN: [id number]  HPI    Review of Systems     Objective:   Physical Exam        Assessment & Plan:

## 2019-06-07 NOTE — Progress Notes (Signed)
Patient seen in the office today and instructed on use of Symbicort 80.  Patient expressed understanding and demonstrated technique. 

## 2019-06-07 NOTE — Progress Notes (Signed)
Synopsis: Referred in October 2020 for shortness of breath by Sheliah Hatch, MD  Subjective:   PATIENT ID: Toniann Ket GENDER: male DOB: 10-27-1975, MRN: [id number]  Chief Complaint  Patient presents with  . Pulmonary Consult    43 year old past medical history of asthma, gastroesophageal reflux disease, headache and panic attacks.  Patient referred for evaluation of shortness of breath. Diagnosed with asthma as a kid. Never hospitalized as a kid. He was told he grew out of it. Born full term. Sister with asthma. Mother and Father no lung issues. Played baseball, basketball in school. 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 Married for 15 years, wife works [employer] as a Engineer, civil (consulting). 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   Weight gain started after stopped smoking. Quit smoking in 2018, [id number], heaviest was smoking 1ppd. Slowly been gaining weight since then. The surgery center closed for covid. He was working out at home. Dr. Beverely Low thought it was related to BB(-). This was stopped.  He is not sure that he see much difference in his symptoms.  Wife complains that he wheezes. Wife says he snores.  Patient is concerned about fatigue.  Not sleeping well.  Erectile dysfunction.  He is currently on medication for this.  Patient states his symptoms were better when he got down to 225 pounds.  Unfortunately he is gained most of this back and is back at 248 pounds today.   Past Medical History:  Diagnosis Date  . Asthma   . GERD (gastroesophageal reflux disease)   . Headache(784.0)   . Panic attacks      Family History  Problem Relation Age of Onset  . Hypertension Mother   . Hypertension Father   . Diabetes Father   . Diabetes Paternal Grandfather      Past Surgical History:  Procedure Laterality Date  . GANGLION CYST EXCISION     L wrist    Social History   Socioeconomic History  . Marital status: Married    Spouse name: Not on file  . Number of  children: Not on file  . Years of education: Not on file  . Highest education level: Not on file  Occupational History  . Not on file  Social Needs  . Financial resource strain: Not on file  . Food insecurity    Worry: Not on file    Inability: Not on file  . Transportation needs    Medical: Not on file    Non-medical: Not on file  Tobacco Use  . Smoking status: Former Smoker    Packs/day: 1.00    Years: 6.00    Pack years: 6.00    Types: Cigarettes    Quit date: 02/28/2017    Years since quitting: 2.2  . Smokeless tobacco: Never Used  Substance and Sexual Activity  . Alcohol use: Yes  . Drug use: No  . Sexual activity: Yes  Lifestyle  . Physical activity    Days per week: Not on file    Minutes per session: Not on file  . Stress: Not on file  Relationships  . Social Musician on phone: Not on file    Gets together: Not on file    Attends religious service: Not on file    Active member of club or organization: Not on file    Attends meetings of clubs or organizations: Not on file    Relationship status: Not on file  .  Intimate partner violence    Fear of current or ex partner: Not on file    Emotionally abused: Not on file    Physically abused: Not on file    Forced sexual activity: Not on file  Other Topics Concern  . Not on file  Social History Narrative  . Not on file     No Known Allergies   Outpatient Medications Prior to Visit  Medication Sig Dispense Refill  . ALPRAZolam (XANAX) 0.5 MG tablet Take 1 tablet (0.5 mg total) by mouth 2 (two) times daily as needed for anxiety. 30 tablet 1  . cetirizine (ZYRTEC) 10 MG tablet Take 1 tablet (10 mg total) by mouth daily. 30 tablet 11  . cyclobenzaprine (FLEXERIL) 10 MG tablet Take 1 tablet (10 mg total) by mouth 3 (three) times daily as needed for muscle spasms. 30 tablet 0  . fluticasone (FLONASE) 50 MCG/ACT nasal spray Place 2 sprays into both nostrils daily. 16 g 6  . omeprazole (PRILOSEC) 40 MG  capsule Take 1 capsule (40 mg total) by mouth daily. 90 capsule 1  . sildenafil (VIAGRA) 100 MG tablet Take 0.5-1 tablets (50-100 mg total) by mouth daily as needed for erectile dysfunction. 5 tablet 11  . terbinafine (LAMISIL) 250 MG tablet Take 1 tablet (250 mg total) by mouth daily. 90 tablet 0  . predniSONE (DELTASONE) 10 MG tablet 3 tabs x3 days and then 2 tabs x3 days and then 1 tab x3 days.  Take w/ food. 18 tablet 0  . albuterol (VENTOLIN HFA) 108 (90 Base) MCG/ACT inhaler Inhale 2 puffs into the lungs every 6 (six) hours as needed for wheezing or shortness of breath. (Patient not taking: Reported on 06/07/2019) 1 Inhaler 2  . beclomethasone (QVAR REDIHALER) 80 MCG/ACT inhaler Inhale 1 puff into the lungs 2 (two) times daily. (Patient not taking: Reported on 06/07/2019) 10.6 g 1  . fluticasone (FLOVENT HFA) 110 MCG/ACT inhaler Inhale 2 puffs into the lungs 2 (two) times daily. (Patient not taking: Reported on 06/07/2019) 1 Inhaler 3   No facility-administered medications prior to visit.     Review of Systems  Constitutional: Positive for malaise/fatigue. Negative for chills, fever and weight loss.  HENT: Negative for hearing loss, sore throat and tinnitus.   Eyes: Negative for blurred vision and double vision.  Respiratory: Positive for shortness of breath. Negative for cough, hemoptysis, sputum production, wheezing and stridor.   Cardiovascular: Negative for chest pain, palpitations, orthopnea, leg swelling and PND.  Gastrointestinal: Negative for abdominal pain, constipation, diarrhea, heartburn, nausea and vomiting.  Genitourinary: Negative for dysuria, hematuria and urgency.  Musculoskeletal: Negative for joint pain and myalgias.  Skin: Negative for itching and rash.  Neurological: Negative for dizziness, tingling, weakness and headaches.  Endo/Heme/Allergies: Negative for environmental allergies. Does not bruise/bleed easily.  Psychiatric/Behavioral: Negative for depression. The  patient is not nervous/anxious and does not have insomnia.   All other systems reviewed and are negative.    Objective:  Physical Exam Vitals signs reviewed.  Constitutional:      General: He is not in acute distress.    Appearance: He is well-developed. He is obese.  HENT:     Head: Normocephalic and atraumatic.     Mouth/Throat:     Comments: Large neck Eyes:     General: No scleral icterus.    Conjunctiva/sclera: Conjunctivae normal.     Pupils: Pupils are equal, round, and reactive to light.  Neck:     Musculoskeletal: Neck supple.  Vascular: No JVD.     Trachea: No tracheal deviation.  Cardiovascular:     Rate and Rhythm: Normal rate and regular rhythm.     Heart sounds: Normal heart sounds. No murmur.  Pulmonary:     Effort: Pulmonary effort is normal. No tachypnea, accessory muscle usage or respiratory distress.     Breath sounds: Normal breath sounds. No stridor. No wheezing, rhonchi or rales.  Abdominal:     General: Bowel sounds are normal. There is no distension.     Palpations: Abdomen is soft.     Tenderness: There is no abdominal tenderness.     Comments: Obese abdomen  Musculoskeletal:        General: No tenderness.  Lymphadenopathy:     Cervical: No cervical adenopathy.  Skin:    General: Skin is warm and dry.     Capillary Refill: Capillary refill takes less than 2 seconds.     Findings: No rash.  Neurological:     Mental Status: He is alert and oriented to person, place, and time.  Psychiatric:        Behavior: Behavior normal.      Vitals:   06/07/19 1556  BP: (!) 136/100  Pulse: 67  SpO2: 98%  Weight: 248 lb (112.5 kg)  Height: 5\' 11"  (1.803 m)   98% on RA BMI Readings from Last 3 Encounters:  06/07/19 34.59 kg/m  05/16/19 34.48 kg/m  03/28/19 33.47 kg/m   Wt Readings from Last 3 Encounters:  06/07/19 248 lb (112.5 kg)  05/16/19 247 lb 4 oz (112.2 kg)  03/28/19 240 lb (108.9 kg)     CBC    Component Value Date/Time    WBC 6.5 02/07/2019 1448   RBC 4.82 02/07/2019 1448   HGB 14.7 02/07/2019 1448   HCT 42.3 02/07/2019 1448   PLT 212 02/07/2019 1448   MCV 87.8 02/07/2019 1448   MCH 30.5 02/07/2019 1448   MCHC 34.8 02/07/2019 1448   RDW 13.5 02/07/2019 1448   LYMPHSABS 2,275 02/07/2019 1448   MONOABS 0.5 08/31/2018 1345   EOSABS 260 02/07/2019 1448   BASOSABS 59 02/07/2019 1448    Chest Imaging: 05/16/2019 chest x-ray: No acute cardiopulmonary findings. The patient's images have been independently reviewed by me.    Pulmonary Functions Testing Results: No flowsheet data found.  FeNO: None   Pathology: None   Echocardiogram: None   Heart Catheterization: None     Assessment & Plan:     ICD-10-CM   1. SOB (shortness of breath)  R06.02 Pulmonary Function Test  2. Insomnia, unspecified type  G47.00   3. Snoring  R06.83   4. Sleep-disordered breathing  G47.30   5. Class 1 obesity due to excess calories without serious comorbidity with body mass index (BMI) of 34.0 to 34.9 in adult  E66.09    Z68.34     Discussion:  43 year old gentleman with complaints of dyspnea on exertion.  He has a history of asthma.  He has a small bit of his current HPI that is consistent with potential asthma flareups.  The weight gain could definitely do this as well has him experiencing some nocturnal symptoms are consistent with bronchial reactivity however he is still able to do most of his exercises and still be active without any wheezing during the day.  With that we will give him samples today of Symbicort and see if that makes any difference in his symptoms.  I do believe that weight loss is going to  be very important for him.  We discussed this today in detail.  And he agrees.  We can start by evaluation of his lung function.  He has smoked for approximately 20 years off and on while he was in the [employer] and a few years after.  Plan: Symbicort samples 80 given today (unable to afford others so has not  ever tried anything)  PFTs upon return to the office. Referral to our sleep clinic for evaluation for potential underlying sleep apnea. Return to clinic in 6 weeks once PFTs are complete. If his pulmonary function tests are abnormal I think he needs additional imaging of the chest. We discussed this today. Encouraged him to get back to his regular routine activities and increasing his exercise tolerance slowly.  Greater than 50% of this patient's 45-minute of visit was been face-to-face discussing above recommendations and treatment plan.   Current Outpatient Medications:  .  ALPRAZolam (XANAX) 0.5 MG tablet, Take 1 tablet (0.5 mg total) by mouth 2 (two) times daily as needed for anxiety., Disp: 30 tablet, Rfl: 1 .  cetirizine (ZYRTEC) 10 MG tablet, Take 1 tablet (10 mg total) by mouth daily., Disp: 30 tablet, Rfl: 11 .  cyclobenzaprine (FLEXERIL) 10 MG tablet, Take 1 tablet (10 mg total) by mouth 3 (three) times daily as needed for muscle spasms., Disp: 30 tablet, Rfl: 0 .  fluticasone (FLONASE) 50 MCG/ACT nasal spray, Place 2 sprays into both nostrils daily., Disp: 16 g, Rfl: 6 .  omeprazole (PRILOSEC) 40 MG capsule, Take 1 capsule (40 mg total) by mouth daily., Disp: 90 capsule, Rfl: 1 .  sildenafil (VIAGRA) 100 MG tablet, Take 0.5-1 tablets (50-100 mg total) by mouth daily as needed for erectile dysfunction., Disp: 5 tablet, Rfl: 11 .  terbinafine (LAMISIL) 250 MG tablet, Take 1 tablet (250 mg total) by mouth daily., Disp: 90 tablet, Rfl: 0 .  albuterol (VENTOLIN HFA) 108 (90 Base) MCG/ACT inhaler, Inhale 2 puffs into the lungs every 6 (six) hours as needed for wheezing or shortness of breath. (Patient not taking: Reported on 06/07/2019), Disp: 1 Inhaler, Rfl: 2 .  beclomethasone (QVAR REDIHALER) 80 MCG/ACT inhaler, Inhale 1 puff into the lungs 2 (two) times daily. (Patient not taking: Reported on 06/07/2019), Disp: 10.6 g, Rfl: 1 .  budesonide-formoterol (SYMBICORT) 80-4.5 MCG/ACT inhaler,  Inhale 2 puffs into the lungs 2 (two) times daily., Disp: 2 Inhaler, Rfl: 0 .  fluticasone (FLOVENT HFA) 110 MCG/ACT inhaler, Inhale 2 puffs into the lungs 2 (two) times daily. (Patient not taking: Reported on 06/07/2019), Disp: 1 Inhaler, Rfl: 3   Josephine IgoBradley L Jakaleb Payer, DO [facility] 06/07/2019 4:02 PM

## 2019-06-07 NOTE — Patient Instructions (Addendum)
Thank you for visiting Dr. Valeta Harms at [facility]. Today we recommend the following:  Orders Placed This Encounter  Procedures  . Pulmonary Function Test   Samples of symbicort   Please set patient up with Dr. Ander Slade for next available sleep consultation.  Return in about 6 weeks (around 07/19/2019).    Please do your part to reduce the spread of COVID-19.

## 2019-06-13 ENCOUNTER — Encounter: Payer: Self-pay | Admitting: Pulmonary Disease

## 2019-06-13 ENCOUNTER — Other Ambulatory Visit: Payer: Self-pay

## 2019-06-13 ENCOUNTER — Ambulatory Visit (INDEPENDENT_AMBULATORY_CARE_PROVIDER_SITE_OTHER): Payer: BC Managed Care – PPO | Admitting: Pulmonary Disease

## 2019-06-13 VITALS — BP 136/78 | HR 75 | Ht 71.0 in | Wt 248.6 lb

## 2019-06-13 DIAGNOSIS — G4733 Obstructive sleep apnea (adult) (pediatric): Secondary | ICD-10-CM | POA: Diagnosis not present

## 2019-06-13 NOTE — Patient Instructions (Signed)
Daytime sleepiness Multiple awakenings at night  Possibility of obstructive sleep apnea  We will schedule you for home sleep study We will apprise you of results as they become available  Set you up with CPAP if needed  I will see you back in the office in about 3 months  Call with significant concerns Sleep Apnea Sleep apnea is a condition in which breathing pauses or becomes shallow during sleep. Episodes of sleep apnea usually last 10 seconds or longer, and they may occur as many as 20 times an hour. Sleep apnea disrupts your sleep and keeps your body from getting the rest that it needs. This condition can increase your risk of certain health problems, including:  Heart attack.  Stroke.  Obesity.  Diabetes.  Heart failure.  Irregular heartbeat. What are the causes? There are three kinds of sleep apnea:  Obstructive sleep apnea. This kind is caused by a blocked or collapsed airway.  Central sleep apnea. This kind happens when the part of the brain that controls breathing does not send the correct signals to the muscles that control breathing.  Mixed sleep apnea. This is a combination of obstructive and central sleep apnea. The most common cause of this condition is a collapsed or blocked airway. An airway can collapse or become blocked if:  Your throat muscles are abnormally relaxed.  Your tongue and tonsils are larger than normal.  You are overweight.  Your airway is smaller than normal. What increases the risk? You are more likely to develop this condition if you:  Are overweight.  Smoke.  Have a smaller than normal airway.  Are elderly.  Are male.  Drink alcohol.  Take sedatives or tranquilizers.  Have a family history of sleep apnea. What are the signs or symptoms? Symptoms of this condition include:  Trouble staying asleep.  Daytime sleepiness and tiredness.  Irritability.  Loud snoring.  Morning headaches.  Trouble concentrating.   Forgetfulness.  Decreased interest in sex.  Unexplained sleepiness.  Mood swings.  Personality changes.  Feelings of depression.  Waking up often during the night to urinate.  Dry mouth.  Sore throat. How is this diagnosed? This condition may be diagnosed with:  A medical history.  A physical exam.  A series of tests that are done while you are sleeping (sleep study). These tests are usually done in a sleep lab, but they may also be done at home. How is this treated? Treatment for this condition aims to restore normal breathing and to ease symptoms during sleep. It may involve managing health issues that can affect breathing, such as high blood pressure or obesity. Treatment may include:  Sleeping on your side.  Using a decongestant if you have nasal congestion.  Avoiding the use of depressants, including alcohol, sedatives, and narcotics.  Losing weight if you are overweight.  Making changes to your diet.  Quitting smoking.  Using a device to open your airway while you sleep, such as: ? An oral appliance. This is a custom-made mouthpiece that shifts your lower jaw forward. ? A continuous positive airway pressure (CPAP) device. This device blows air through a mask when you breathe out (exhale). ? A nasal expiratory positive airway pressure (EPAP) device. This device has valves that you put into each nostril. ? A bi-level positive airway pressure (BPAP) device. This device blows air through a mask when you breathe in (inhale) and breathe out (exhale).  Having surgery if other treatments do not work. During surgery, excess tissue is  removed to create a wider airway. It is important to get treatment for sleep apnea. Without treatment, this condition can lead to:  High blood pressure.  Coronary artery disease.  In men, an inability to achieve or maintain an erection (impotence).  Reduced thinking abilities. Follow these instructions at home: Lifestyle  Make any  lifestyle changes that your health care provider recommends.  Eat a healthy, well-balanced diet.  Take steps to lose weight if you are overweight.  Avoid using depressants, including alcohol, sedatives, and narcotics.  Do not use any products that contain nicotine or tobacco, such as cigarettes, e-cigarettes, and chewing tobacco. If you need help quitting, ask your health care provider. General instructions  Take over-the-counter and prescription medicines only as told by your health care provider.  If you were given a device to open your airway while you sleep, use it only as told by your health care provider.  If you are having surgery, make sure to tell your health care provider you have sleep apnea. You may need to bring your device with you.  Keep all follow-up visits as told by your health care provider. This is important. Contact a health care provider if:  The device that you received to open your airway during sleep is uncomfortable or does not seem to be working.  Your symptoms do not improve.  Your symptoms get worse. Get help right away if:  You develop: ? Chest pain. ? Shortness of breath. ? Discomfort in your back, arms, or stomach.  You have: ? Trouble speaking. ? Weakness on one side of your body. ? Drooping in your face. These symptoms may represent a serious problem that is an emergency. Do not wait to see if the symptoms will go away. Get medical help right away. Call your local emergency services (911 in the [place] Do not drive yourself to the hospital. Summary  Sleep apnea is a condition in which breathing pauses or becomes shallow during sleep.  The most common cause is a collapsed or blocked airway.  The goal of treatment is to restore normal breathing and to ease symptoms during sleep. This information is not intended to replace advice given to you by your health care provider. Make sure you discuss any questions you have with your health care  provider. Document Released: 07/30/2002 Document Revised: 05/26/2018 Document Reviewed: 04/04/2018 Elsevier Patient Education  2020 [employer].

## 2019-06-13 NOTE — Progress Notes (Addendum)
Richard Hanna    [id number]    [birth year]  Primary Care Physician:Tabori, Helane RimaKatherine E, MD  Referring Physician: Sheliah Hatchabori, Katherine E, MD [address] [place],  [place] [id number]  Chief complaint:   Patient with a history of snoring  HPI:  Patient with a history of snoring Was recently seen by Dr. Tonia BroomsIcard in the office Previously seen by Dr. Vassie LollAlva, home sleep study was recommended which he did not follow-up on  He comes back in today with similar symptoms Witnessed apneas during sleep, snoring Daytime sleepiness Multiple awakenings at night Usual bedtime between 11 and 12 midnight Wakes up by 2 hours later and then a few other times during the night Final awakening time about 6 AM Quit smoking in 2018 Had some weight gain recently  Occasional headaches No dryness of his mouth in the mornings  His memory is fine No night sweats  Mom does snore  Breathing feels stable at the present time   Outpatient Encounter Medications as of 06/13/2019  Medication Sig  . albuterol (VENTOLIN HFA) 108 (90 Base) MCG/ACT inhaler Inhale 2 puffs into the lungs every 6 (six) hours as needed for wheezing or shortness of breath.  . ALPRAZolam (XANAX) 0.5 MG tablet Take 1 tablet (0.5 mg total) by mouth 2 (two) times daily as needed for anxiety.  . beclomethasone (QVAR REDIHALER) 80 MCG/ACT inhaler Inhale 1 puff into the lungs 2 (two) times daily.  . budesonide-formoterol (SYMBICORT) 80-4.5 MCG/ACT inhaler Inhale 2 puffs into the lungs 2 (two) times daily.  . cetirizine (ZYRTEC) 10 MG tablet Take 1 tablet (10 mg total) by mouth daily.  . cyclobenzaprine (FLEXERIL) 10 MG tablet Take 1 tablet (10 mg total) by mouth 3 (three) times daily as needed for muscle spasms.  . fluticasone (FLONASE) 50 MCG/ACT nasal spray Place 2 sprays into both nostrils daily.  . fluticasone (FLOVENT HFA) 110 MCG/ACT inhaler Inhale 2 puffs into the lungs 2 (two) times daily.  Marland Kitchen. omeprazole (PRILOSEC) 40 MG  capsule Take 1 capsule (40 mg total) by mouth daily.  . sildenafil (VIAGRA) 100 MG tablet Take 0.5-1 tablets (50-100 mg total) by mouth daily as needed for erectile dysfunction.  . terbinafine (LAMISIL) 250 MG tablet Take 1 tablet (250 mg total) by mouth daily.   No facility-administered encounter medications on file as of 06/13/2019.     Allergies as of 06/13/2019  . (No Known Allergies)    Past Medical History:  Diagnosis Date  . Asthma   . GERD (gastroesophageal reflux disease)   . Headache(784.0)   . Panic attacks     Past Surgical History:  Procedure Laterality Date  . GANGLION CYST EXCISION     L wrist    Family History  Problem Relation Age of Onset  . Hypertension Mother   . Hypertension Father   . Diabetes Father   . Diabetes Paternal Grandfather     Social History   Socioeconomic History  . Marital status: Married    Spouse name: Not on file  . Number of children: Not on file  . Years of education: Not on file  . Highest education level: Not on file  Occupational History  . Not on file  Social Needs  . Financial resource strain: Not on file  . Food insecurity    Worry: Not on file    Inability: Not on file  . Transportation needs    Medical: Not on file  Non-medical: Not on file  Tobacco Use  . Smoking status: Former Smoker    Packs/day: 1.00    Years: 6.00    Pack years: 6.00    Types: Cigarettes    Quit date: 02/28/2017    Years since quitting: 2.2  . Smokeless tobacco: Never Used  Substance and Sexual Activity  . Alcohol use: Yes  . Drug use: No  . Sexual activity: Yes  Lifestyle  . Physical activity    Days per week: Not on file    Minutes per session: Not on file  . Stress: Not on file  Relationships  . Social Herbalist on phone: Not on file    Gets together: Not on file    Attends religious service: Not on file    Active member of club or organization: Not on file    Attends meetings of clubs or organizations: Not  on file    Relationship status: Not on file  . Intimate partner violence    Fear of current or ex partner: Not on file    Emotionally abused: Not on file    Physically abused: Not on file    Forced sexual activity: Not on file  Other Topics Concern  . Not on file  Social History Narrative  . Not on file    Review of Systems  Constitutional: Positive for unexpected weight change.  HENT: Negative.   Cardiovascular: Negative.   Gastrointestinal: Negative.   Musculoskeletal: Negative.   Psychiatric/Behavioral: Positive for sleep disturbance.    Vitals:   06/13/19 1511  BP: 136/78  Pulse: 75  SpO2: 99%     Physical Exam  Constitutional: He appears well-developed and well-nourished.  Eyes: Pupils are equal, round, and reactive to light. Right eye exhibits no discharge. Left eye exhibits no discharge.  Neck: Normal range of motion. Neck supple. No thyromegaly present.  Neck is thick  Cardiovascular: Normal rate and regular rhythm.  Pulmonary/Chest: Effort normal and breath sounds normal. No respiratory distress. He has no wheezes. He has no rales. He exhibits no tenderness.  Abdominal: Soft. Bowel sounds are normal. He exhibits no distension.  Musculoskeletal: Normal range of motion.        General: No edema.  Neurological: He is alert. No cranial nerve deficit.  Skin: Skin is warm.   Results of the Epworth flowsheet 06/13/2019 12/22/2016  Sitting and reading 2 1  Watching TV 2 2  Sitting, inactive in a public place (e.g. a theatre or a meeting) 1 1  As a passenger in a car for an hour without a break 2 2  Lying down to rest in the afternoon when circumstances permit 3 3  Sitting and talking to someone 1 1  Sitting quietly after a lunch without alcohol 2 2  In a car, while stopped for a few minutes in traffic 0 0  Total score 13 12    Assessment:  High probability of significant sleep disordered breathing -Snoring, witnessed apneas, excessive daytime sleepiness   Excessive daytime sleepiness -Likely related to presence of obstructive sleep apnea  Obesity -Weight gain recently may have worsened symptoms and severity of sleep disordered breathing  History of asthma  Pathophysiology of sleep disordered breathing discussed with the patient  Treatment options for sleep disordered breathing discussed with the patient  Plan/Recommendations: We will schedule patient for home sleep study  Treatment options as discussed  I will see him back in the office in about 3 months  Virl Diamond MD [facility] 06/13/2019, 3:30 PM  CC: Sheliah Hatch, MD

## 2019-06-25 ENCOUNTER — Encounter: Payer: Self-pay | Admitting: Family Medicine

## 2019-06-25 MED FILL — ALPRAZolam 0.5 MG TABS: 0.5 | 15 days supply | Qty: 30 | Fill #1

## 2019-07-12 MED FILL — OMEPRAZOLE 40 MG CPDR: 40 | 30 days supply | Qty: 30 | Fill #3

## 2019-07-16 ENCOUNTER — Ambulatory Visit (INDEPENDENT_AMBULATORY_CARE_PROVIDER_SITE_OTHER): Payer: BC Managed Care – PPO | Admitting: Family Medicine

## 2019-07-16 ENCOUNTER — Other Ambulatory Visit: Payer: Self-pay

## 2019-07-16 ENCOUNTER — Encounter: Payer: Self-pay | Admitting: Family Medicine

## 2019-07-16 VITALS — BP 124/70 | HR 71 | Temp 98.5°F | Ht 71.0 in | Wt 254.0 lb

## 2019-07-16 DIAGNOSIS — F411 Generalized anxiety disorder: Secondary | ICD-10-CM

## 2019-07-16 DIAGNOSIS — I10 Essential (primary) hypertension: Secondary | ICD-10-CM | POA: Diagnosis not present

## 2019-07-16 DIAGNOSIS — K219 Gastro-esophageal reflux disease without esophagitis: Secondary | ICD-10-CM

## 2019-07-16 DIAGNOSIS — R0602 Shortness of breath: Secondary | ICD-10-CM

## 2019-07-16 DIAGNOSIS — F410 Panic disorder [episodic paroxysmal anxiety] without agoraphobia: Secondary | ICD-10-CM

## 2019-07-16 MED ORDER — ESCITALOPRAM OXALATE 20 MG PO TABS: 20.0000 mg | ORAL_TABLET | Freq: Every day | ORAL | 1 refills | Status: DC

## 2019-07-16 NOTE — Progress Notes (Signed)
New Patient Office Visit  Subjective:  Patient ID: Richard Hanna, male    DOB: 12/24/1975  Age: 43 y.o. MRN: [id number]  CC:  Chief Complaint  Patient presents with  . Establish Care  . Hypertension  . 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  He reports that he was previously more active.   Panic attacks He states that he started having panic attacks a few years ago  In the past 9 months he is taking the anxiety meds more and has to leave the floor at work to go take alprazolam. [facility] 07/16/2019 05/16/2019 03/06/2019 02/07/2019 02/01/2019  Decreased Interest 0 0 0 0 0  Down, Depressed, Hopeless 0 0 0 0 0  PHQ - 2 Score 0 0 0 0 0  Altered sleeping - 0 0 0 -  Tired, decreased energy - 0 0 0 -  Change in appetite - 0 0 0 -  Feeling bad or failure about yourself  - 0 0 0 -  Trouble concentrating - 0 0 0 -  Moving slowly or fidgety/restless - 0 0 0 -  Suicidal thoughts - 0 0 0 -  PHQ-9 Score - 0 0 0 -  Difficult doing work/chores - Not difficult at all Not difficult at all Not difficult at all -   GAD 7 : Generalized Anxiety Score 07/16/2019   Nervous, Anxious, on Edge 1  Control/stop worrying 1  Worry too much - different things 1  Trouble relaxing 1  Restless 0  Easily annoyed or irritable 1  Afraid - awful might happen 1  Total GAD 7 Score 6  Anxiety Difficulty Very difficult      GERD If he goes 1-2 days without omeprazole he has to get alka seltzer before he can sleep at night. He uses flonase for congestion.   Family history of diabetes He states that he was treated for toe fungus and was concerned that he did not take it because of concern of the liver.     Past Medical History:  Diagnosis Date  . Asthma   . GERD (gastroesophageal reflux disease)   . Headache(784.0)   . Panic attacks     Past Surgical History:  Procedure Laterality Date  . GANGLION CYST EXCISION     L wrist    Family History  Problem Relation Age of Onset  . Hypertension Mother   . Hypertension Father   . Diabetes Father   . Diabetes Paternal Grandfather     Social History   Socioeconomic History  . Marital  status: Married    Spouse name: Not on file  . Number of children: Not on file  . Years of education: Not on file  . Highest education level: Not on file  Occupational History  . Not on file  Social Needs  . Financial resource strain: Not on file  . Food insecurity    Worry: Not on file    Inability: Not on file  . Transportation needs    Medical: Not on file    Non-medical: Not on file  Tobacco Use  . Smoking status: Former Smoker    Packs/day: 1.00    Years: 6.00    Pack years: 6.00    Types: Cigarettes    Quit date: 02/28/2017    Years since quitting: 2.3  . Smokeless tobacco: Never Used  Substance and Sexual Activity  . Alcohol use: Yes  . Drug use: No  . Sexual activity: Yes  Lifestyle  . Physical activity    Days per week: Not on file    Minutes per session: Not on file  . Stress: Not on file  Relationships  . Social Herbalist on phone: Not on file    Gets together: Not on file     Attends religious service: Not on file    Active member of club or organization: Not on file    Attends meetings of clubs or organizations: Not on file    Relationship status: Not on file  . Intimate partner violence    Fear of current or ex partner: Not on file    Emotionally abused: Not on file    Physically abused: Not on file    Forced sexual activity: Not on file  Other Topics Concern  . Not on file  Social History Narrative  . Not on file    ROS Review of Systems Review of Systems  Constitutional: Negative for activity change, appetite change, chills and fever.  HENT: Negative for congestion, nosebleeds, trouble swallowing and voice change.   Respiratory: Negative for cough, shortness of breath and wheezing.   Gastrointestinal: Negative for diarrhea, nausea and vomiting.  Genitourinary: Negative for difficulty urinating, dysuria, flank pain and hematuria.  Musculoskeletal: Negative for back pain, joint swelling and neck pain.  Neurological: Negative for dizziness, speech difficulty, light-headedness and numbness.  See HPI. All other review of systems negative.   Objective:   Today's Vitals: BP 124/70 (BP Location: Left Arm, Patient Position: Sitting, Cuff Size: Large)   Pulse 71   Temp 98.5 F (36.9 C) (Oral)   Ht 5\' 11"  (1.803 m)   Wt 254 lb (115.2 kg)   SpO2 96%   BMI 35.43 kg/m  Wt Readings from Last 3 Encounters:  07/16/19 254 lb (115.2 kg)  06/13/19 248 lb 9.6 oz (112.8 kg)  06/07/19 248 lb (112.5 kg)    Physical Exam  Physical Exam  Constitutional: Oriented to person, place, and time. Appears well-developed and well-nourished.  HENT:  Head: Normocephalic and atraumatic.  Eyes: Conjunctivae and EOM are normal.  Cardiovascular: Normal rate, regular rhythm, normal heart sounds and intact distal pulses.  No murmur heard. Pulmonary/Chest: Effort normal and breath sounds normal. No stridor. No respiratory distress. Has no wheezes.  Neurological: Is alert  and oriented to person, place, and time.  Skin: Skin is warm. Capillary refill takes less than 2 seconds.  Psychiatric: Has a normal mood and affect. Behavior is normal. Judgment and thought content normal.     Assessment & Plan:  Problem List Items Addressed This Visit      Cardiovascular and Mediastinum   Essential hypertension     Digestive   G E R D     Other   Generalized anxiety disorder - Primary   Relevant Medications   escitalopram (LEXAPRO) 20 MG tablet    Other Visit Diagnoses    SOB (shortness of breath)       Panic attacks       Relevant Medications   escitalopram (LEXAPRO) 20 MG tablet     Hypertension  Discussed that his hypertension might be provoked by his stress and anxiety Would like to treat his stress and anxiety first He should be on a daily anxiety medication - suggested Lexapro, R/B/SE reviewed and patient is in agreement  Will also await results of sleep study as well as he has a history of sleep apnea  Shortness of breath For his shortness of breath advised him to get records from the [place] about his diagnosis of exercise induced asthma He should also follow up with Pulmonology regarding his shortness of breath   GERD  Advised him to continue avoiding food triggers Reflux meds prn Weight loss  Exercise and stress mgmt  Outpatient Encounter Medications as of 07/16/2019  Medication Sig  . ALPRAZolam (XANAX) 0.5 MG tablet Take 1 tablet (0.5 mg total) by mouth 2 (two) times daily as needed for anxiety.  . budesonide-formoterol (SYMBICORT) 80-4.5 MCG/ACT inhaler Inhale 2 puffs into the lungs 2 (two) times daily.  . cetirizine (ZYRTEC) 10 MG tablet Take 1 tablet (10 mg total) by mouth daily.  . fluticasone (FLONASE) 50 MCG/ACT nasal spray Place 2 sprays into both nostrils daily.  Marland Kitchen omeprazole (PRILOSEC) 40 MG capsule Take 1 capsule (40 mg total) by mouth daily.  . sildenafil (VIAGRA) 100 MG tablet Take 0.5-1 tablets (50-100 mg total) by mouth  daily as needed for erectile dysfunction.  Marland Kitchen escitalopram (LEXAPRO) 20 MG tablet Take 1 tablet (20 mg total) by mouth daily.  . [DISCONTINUED] albuterol (VENTOLIN HFA) 108 (90 Base) MCG/ACT inhaler Inhale 2 puffs into the lungs every 6 (six) hours as needed for wheezing or shortness of breath.  . [DISCONTINUED] beclomethasone (QVAR REDIHALER) 80 MCG/ACT inhaler Inhale 1 puff into the lungs 2 (two) times daily. (Patient not taking: Reported on 07/16/2019)  . [DISCONTINUED] cyclobenzaprine (FLEXERIL) 10 MG tablet Take 1 tablet (10 mg total) by mouth 3 (three) times daily as needed for muscle spasms.  . [DISCONTINUED] fluticasone (FLOVENT HFA) 110 MCG/ACT inhaler Inhale 2 puffs into the lungs 2 (two) times daily.  . [DISCONTINUED] terbinafine (LAMISIL) 250 MG tablet Take 1 tablet (250 mg total) by mouth daily.   No facility-administered encounter medications on file as of 07/16/2019.     Follow-up: Return for  6 week follow up .   Doristine Bosworth, MD

## 2019-07-16 NOTE — Patient Instructions (Addendum)
1. Follow up with your sleep testing.  If you cannot get time off then request FMLA and I will fill it out so you can get your test.   2. Keep an eye on the blood pressure but will not start meds right now  3. We should treat the toe nail fungus after we are finished with the sleep test and the lung test.  This is safe as long as your liver is healthy.  4.  Reflux- continue daily omeprazole and add tums as needed.  Once you are stable on the lexapro for anxiety much of the reflux gets better.  Also weight loss can improve the reflux.  See the handout on foods to avoid.   Lexapro - take 1/2 tablet for 3 days then increase to one tablet daily.  5. Go to [facility] to see about your exercise induced asthma history.   6. Follow up in 6 weeks to sit down and go over everything.      If you have lab work done today you will be contacted with your lab results within the next 2 weeks.  If you have not heard from [place] then please contact us. The fastest way to get your results is to register for My Chart.   IF you received an x-ray today, you will receive an invoice from [facility]. Please contact [facility] at [phone] with questions or concerns regarding your invoice.   IF you received labwork today, you will receive an invoice from [facility] [place]. Please contact [facility] at [phone] with questions or concerns regarding your invoice.   Our billing staff will not be able to assist you with questions regarding bills from these companies.  You will be contacted with the lab results as soon as they are available. The fastest way to get your results is to activate your My Chart account. Instructions are located on the last page of this paperwork. If you have not heard from [place] regarding the results in 2 weeks, please contact this office.

## 2019-07-20 DIAGNOSIS — S0181XA Laceration without foreign body of other part of head, initial encounter: Secondary | ICD-10-CM | POA: Diagnosis not present

## 2019-08-06 ENCOUNTER — Other Ambulatory Visit (HOSPITAL_COMMUNITY)
Admission: RE | Admit: 2019-08-06 | Discharge: 2019-08-06 | Disposition: A | Discharge status: Home / Self Care | Payer: BC Managed Care – PPO | Source: Ambulatory Visit | Attending: Pulmonary Disease | Admitting: Pulmonary Disease

## 2019-08-06 DIAGNOSIS — Z01812 Encounter for preprocedural laboratory examination: Secondary | ICD-10-CM | POA: Diagnosis not present

## 2019-08-06 DIAGNOSIS — Z20828 Contact with and (suspected) exposure to other viral communicable diseases: Secondary | ICD-10-CM | POA: Diagnosis not present

## 2019-08-07 LAB — NOVEL CORONAVIRUS, NAA (HOSP ORDER, SEND-OUT TO REF LAB; TAT 18-24 HRS): SARS-CoV-2, NAA: NOT DETECTED

## 2019-08-08 ENCOUNTER — Encounter: Payer: Self-pay | Admitting: *Deleted

## 2019-08-09 ENCOUNTER — Ambulatory Visit (INDEPENDENT_AMBULATORY_CARE_PROVIDER_SITE_OTHER): Payer: BC Managed Care – PPO | Admitting: Pulmonary Disease

## 2019-08-09 ENCOUNTER — Other Ambulatory Visit: Payer: Self-pay

## 2019-08-09 ENCOUNTER — Encounter: Payer: Self-pay | Admitting: Pulmonary Disease

## 2019-08-09 VITALS — BP 122/80 | HR 66 | Temp 98.7°F | Ht 72.0 in | Wt 251.0 lb

## 2019-08-09 DIAGNOSIS — R0609 Other forms of dyspnea: Secondary | ICD-10-CM

## 2019-08-09 DIAGNOSIS — J4530 Mild persistent asthma, uncomplicated: Secondary | ICD-10-CM

## 2019-08-09 DIAGNOSIS — R0600 Dyspnea, unspecified: Secondary | ICD-10-CM | POA: Diagnosis not present

## 2019-08-09 DIAGNOSIS — J449 Chronic obstructive pulmonary disease, unspecified: Secondary | ICD-10-CM

## 2019-08-09 DIAGNOSIS — R0602 Shortness of breath: Secondary | ICD-10-CM | POA: Diagnosis not present

## 2019-08-09 DIAGNOSIS — Z77098 Contact with and (suspected) exposure to other hazardous, chiefly nonmedicinal, chemicals: Secondary | ICD-10-CM | POA: Diagnosis not present

## 2019-08-09 LAB — PULMONARY FUNCTION TEST
DL/VA % pred: 106 %
DL/VA: 4.82 ml/min/mmHg/L
DLCO unc % pred: 93 %
DLCO unc: 30.31 ml/min/mmHg
FEF 25-75 Post: 1.27 L/sec
FEF 25-75 Pre: 1.1 L/sec
FEF2575-%Change-Post: 15 %
FEF2575-%Pred-Post: 33 %
FEF2575-%Pred-Pre: 29 %
FEV1-%Change-Post: 4 %
FEV1-%Pred-Post: 62 %
FEV1-%Pred-Pre: 59 %
FEV1-Post: 2.33 L
FEV1-Pre: 2.23 L
FEV1FVC-%Change-Post: 0 %
FEV1FVC-%Pred-Pre: 68 %
FEV6-%Change-Post: 6 %
FEV6-%Pred-Post: 91 %
FEV6-%Pred-Pre: 86 %
FEV6-Post: 4.14 L
FEV6-Pre: 3.89 L
FEV6FVC-%Change-Post: 0 %
FEV6FVC-%Pred-Post: 99 %
FEV6FVC-%Pred-Pre: 98 %
FVC-%Change-Post: 5 %
FVC-%Pred-Post: 91 %
FVC-%Pred-Pre: 87 %
FVC-Post: 4.24 L
FVC-Pre: 4.02 L
Post FEV1/FVC ratio: 55 %
Post FEV6/FVC ratio: 98 %
Pre FEV1/FVC ratio: 55 %
Pre FEV6/FVC Ratio: 97 %
RV % pred: 100 %
RV: 2.02 L
TLC % pred: 89 %
TLC: 6.61 L

## 2019-08-09 MED ORDER — TRELEGY ELLIPTA 200-62.5-25 MCG/INH IN AEPB: 1.0000 | INHALATION_SPRAY | Freq: Every day | RESPIRATORY_TRACT | 0 refills | Status: DC

## 2019-08-09 NOTE — Patient Instructions (Addendum)
Thank you for visiting Dr. Valeta Harms at [facility]. Today we recommend the following: Orders Placed This Encounter  Procedures  . HRCT ILD - CT CHEST HIGH RESOLUTION  . Alpha-1 antitrypsin phenotype  . CBC with Differential/Platelet  . Allergy Profile/RAST  . IgE   Meds ordered this encounter  Medications  . Fluticasone-Umeclidin-Vilant (TRELEGY ELLIPTA) 200-62.5-25 MCG/INH AEPB    Sig: Inhale 1 puff into the lungs daily.    Dispense:  1 each    Refill:  0    Order Specific Question:   Lot Number?    Answer:   [id number]    Order Specific Question:   Expiration Date?    Answer:   01/21/2021    Order Specific Question:   Manufacturer?    Answer:   [employer] [[id number]]    Order Specific Question:   Quantity    Answer:   1   Return in about 6 weeks (around 09/20/2019).    Please do your part to reduce the spread of COVID-19.

## 2019-08-09 NOTE — Progress Notes (Signed)
Synopsis: Referred in October 2020 for shortness of breath by Sheliah Hatch, MD  Subjective:   PATIENT ID: Richard Hanna DOB: 11/17/1975, MRN: [id number]  Chief Complaint  Patient presents with   Follow-up    43 year old past medical history of asthma, gastroesophageal reflux disease, headache and panic attacks.  Patient referred for evaluation of shortness of breath. Diagnosed with asthma as a kid. Never hospitalized as a kid. He was told he grew out of it. Born full term. Sister with asthma. Mother and Father no lung issues. Played baseball, basketball in school. 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 Married for 15 years, wife works [employer] as a Engineer, civil (consulting). 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   Weight gain started after stopped smoking. Quit smoking in 2018, [id number], heaviest was smoking 1ppd. Slowly been gaining weight since then. The surgery center closed for covid. He was working out at home. Dr. Beverely Low thought it was related to BB(-). This was stopped.  He is not sure that he see much difference in his symptoms.  Wife complains that he wheezes. Wife says he snores.  Patient is concerned about fatigue.  Not sleeping well.  Erectile dysfunction.  He is currently on medication for this.  Patient states his symptoms were better when he got down to 225 pounds.  Unfortunately he is gained most of this back and is back at 248 pounds today.  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 In 2007 they rountinely mixed without masks, [employer]   OV 08/09/2019: Since last office visit patient was seen by Dr. Wynona Neat for sleep consultation concern for high probability of sleep disordered breathing plans to get him scheduled for a home sleep study.  Patient has also seen his primary care provider being treated for hypertension and panic attacks.  Last  office visit patient was trialed on Symbicort samples.  Here today for follow-up.  Patient had pulmonary function test completed prior to the office visit.  This reveals a decreased ratio with an FEV1 of 62% predicted.  Lung volumes with no evidence of restriction, normal DLCO.  Patient still having dyspnea on exertion.  He is able to complete his activities of daily living.  No known current exposures however please see occupational history above.    Past Medical History:  Diagnosis Date   Asthma    GERD (gastroesophageal reflux disease)    Headache(784.0)    Panic attacks      Family History  Problem Relation Age of Onset   Hypertension Mother    Hypertension Father    Diabetes Father    Diabetes Paternal Grandfather      Past Surgical History:  Procedure Laterality Date   GANGLION CYST EXCISION     L wrist    Social History   Socioeconomic History   Marital status: Married    Spouse name: Not on file   Number of children: Not on file   Years of education: Not on file   Highest education level: Not on file  Occupational History   Not on file  Tobacco Use   Smoking status: Former Smoker    Packs/day: 1.00    Years: 6.00    Pack years: 6.00    Types: Cigarettes    Quit date: 02/28/2017    Years since quitting: 2.4   Smokeless tobacco: Never Used  Substance and  Sexual Activity   Alcohol use: Yes   Drug use: No   Sexual activity: Yes  Other Topics Concern   Not on file  Social History Narrative   Not on file   Social Determinants of Health   Financial Resource Strain:    Difficulty of Paying Living Expenses: Not on file  Food Insecurity:    Worried About [place] in the Last Year: Not on file   Ran Out of Food in the Last Year: Not on file  Transportation Needs:    Lack of Transportation (Medical): Not on file   Lack of Transportation (Non-Medical): Not on file  Physical Activity:    Days of Exercise per Week: Not on  file   Minutes of Exercise per Session: Not on file  Stress:    Feeling of Stress : Not on file  Social Connections:    Frequency of Communication with Friends and Family: Not on file   Frequency of Social Gatherings with Friends and Family: Not on file   Attends Religious Services: Not on file   Active Member of Clubs or Organizations: Not on file   Attends Archivist Meetings: Not on file   Marital Status: Not on file  Intimate Partner Violence:    Fear of Current or Ex-Partner: Not on file   Emotionally Abused: Not on file   Physically Abused: Not on file   Sexually Abused: Not on file     No Known Allergies   Outpatient Medications Prior to Visit  Medication Sig Dispense Refill   ALPRAZolam (XANAX) 0.5 MG tablet Take 1 tablet (0.5 mg total) by mouth 2 (two) times daily as needed for anxiety. 30 tablet 1   budesonide-formoterol (SYMBICORT) 80-4.5 MCG/ACT inhaler Inhale 2 puffs into the lungs 2 (two) times daily. 2 Inhaler 0   cetirizine (ZYRTEC) 10 MG tablet Take 1 tablet (10 mg total) by mouth daily. 30 tablet 11   escitalopram (LEXAPRO) 20 MG tablet Take 1 tablet (20 mg total) by mouth daily. 30 tablet 1   fluticasone (FLONASE) 50 MCG/ACT nasal spray Place 2 sprays into both nostrils daily. 16 g 6   omeprazole (PRILOSEC) 40 MG capsule Take 1 capsule (40 mg total) by mouth daily. 90 capsule 1   sildenafil (VIAGRA) 100 MG tablet Take 0.5-1 tablets (50-100 mg total) by mouth daily as needed for erectile dysfunction. 5 tablet 11   No facility-administered medications prior to visit.    Review of Systems  Constitutional: Negative for chills, fever, malaise/fatigue and weight loss.  HENT: Negative for hearing loss, sore throat and tinnitus.   Eyes: Negative for blurred vision and double vision.  Respiratory: Positive for shortness of breath and wheezing. Negative for cough, hemoptysis, sputum production and stridor.   Cardiovascular: Negative for  chest pain, palpitations, orthopnea, leg swelling and PND.  Gastrointestinal: Negative for abdominal pain, constipation, diarrhea, heartburn, nausea and vomiting.  Genitourinary: Negative for dysuria, hematuria and urgency.  Musculoskeletal: Negative for joint pain and myalgias.  Skin: Negative for itching and rash.  Neurological: Negative for dizziness, tingling, weakness and headaches.  Endo/Heme/Allergies: Negative for environmental allergies. Does not bruise/bleed easily.  Psychiatric/Behavioral: Negative for depression. The patient is not nervous/anxious and does not have insomnia.   All other systems reviewed and are negative.    Objective:  Physical Exam Vitals reviewed.  Constitutional:      General: He is not in acute distress.    Appearance: He is well-developed. He is obese.  HENT:     Head: Normocephalic and atraumatic.  Eyes:     General: No scleral icterus.    Conjunctiva/sclera: Conjunctivae normal.     Pupils: Pupils are equal, round, and reactive to light.  Neck:     Vascular: No JVD.     Trachea: No tracheal deviation.  Cardiovascular:     Rate and Rhythm: Normal rate and regular rhythm.     Heart sounds: Normal heart sounds. No murmur.  Pulmonary:     Effort: Pulmonary effort is normal. No tachypnea, accessory muscle usage or respiratory distress.     Breath sounds: No stridor. No wheezing, rhonchi or rales.  Abdominal:     General: Bowel sounds are normal. There is no distension.     Palpations: Abdomen is soft.     Tenderness: There is no abdominal tenderness.  Musculoskeletal:        General: No tenderness.     Cervical back: Neck supple.  Lymphadenopathy:     Cervical: No cervical adenopathy.  Skin:    General: Skin is warm and dry.     Capillary Refill: Capillary refill takes less than 2 seconds.     Findings: No rash.  Neurological:     Mental Status: He is alert and oriented to person, place, and time.  Psychiatric:        Behavior: Behavior  normal.      Vitals:   08/09/19 1556  BP: 122/80  Pulse: 66  Temp: 98.7 F (37.1 C)  TempSrc: Oral  SpO2: 98%  Weight: 251 lb (113.9 kg)  Height: 6' (1.829 m)   98% on RA BMI Readings from Last 3 Encounters:  08/09/19 34.04 kg/m  07/16/19 35.43 kg/m  06/13/19 34.67 kg/m   Wt Readings from Last 3 Encounters:  08/09/19 251 lb (113.9 kg)  07/16/19 254 lb (115.2 kg)  06/13/19 248 lb 9.6 oz (112.8 kg)     CBC    Component Value Date/Time   WBC 6.5 02/07/2019 1448   RBC 4.82 02/07/2019 1448   HGB 14.7 02/07/2019 1448   HCT 42.3 02/07/2019 1448   PLT 212 02/07/2019 1448   MCV 87.8 02/07/2019 1448   MCH 30.5 02/07/2019 1448   MCHC 34.8 02/07/2019 1448   RDW 13.5 02/07/2019 1448   LYMPHSABS 2,275 02/07/2019 1448   MONOABS 0.5 08/31/2018 1345   EOSABS 260 02/07/2019 1448   BASOSABS 59 02/07/2019 1448    Chest Imaging: 05/16/2019 chest x-ray: No acute cardiopulmonary findings. The patient's images have been independently reviewed by me.    Pulmonary Functions Testing Results: PFT Results Latest Ref Rng & Units 08/09/2019  FVC-Pre L 4.02  FVC-Predicted Pre % 87  FVC-Post L 4.24  FVC-Predicted Post % 91  Pre FEV1/FVC % % 55  Post FEV1/FCV % % 55  FEV1-Pre L 2.23  FEV1-Predicted Pre % 59  FEV1-Post L 2.33  DLCO UNC% % 93  DLCO COR %Predicted % 106  TLC L 6.61  TLC % Predicted % 89  RV % Predicted % 100    FeNO: None   Pathology: None   Echocardiogram: None   Heart Catheterization: None     Assessment & Plan:     ICD-10-CM   1. Moderate COPD (chronic obstructive pulmonary disease) (HCC)  J44.9 Alpha-1 antitrypsin phenotype    HRCT ILD - CT CHEST HIGH RESOLUTION    Alpha-1 antitrypsin phenotype    CBC with Differential/Platelet    Allergy Profile/RAST    IgE  2.  DOE (dyspnea on exertion)  R06.00 HRCT ILD - CT CHEST HIGH RESOLUTION    CBC with Differential/Platelet    Allergy Profile/RAST    IgE  3. Occupational exposure to chemicals   Z77.098 Allergy Profile/RAST    IgE  4. Mild persistent asthma without complication  J45.30     Discussion: This is a 43 year old gentleman with a past medical history of asthma, started as a child.  Experience symptoms when he was in the service labeled as exercise-induced asthma during [employer] service.  Pulmonary function test completed today reveals evidence of moderate obstructive lung disease.  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  Plan: HRCT imaging with inspiratory expiratory prone and supine imaging. Alpha-1 antitrypsin testing. RAST panel, CBC with differential, IgE Start Trelegy 200 Stop Symbicort.  Greater than 50% of this patient's 25-minute visit was spent face-to-face discussing above recommendations and treatment plan.    Current Outpatient Medications:    ALPRAZolam (XANAX) 0.5 MG tablet, Take 1 tablet (0.5 mg total) by mouth 2 (two) times daily as needed for anxiety., Disp: 30 tablet, Rfl: 1   budesonide-formoterol (SYMBICORT) 80-4.5 MCG/ACT inhaler, Inhale 2 puffs into the lungs 2 (two) times daily., Disp: 2 Inhaler, Rfl: 0   cetirizine (ZYRTEC) 10 MG tablet, Take 1 tablet (10 mg total) by mouth daily., Disp: 30 tablet, Rfl: 11   escitalopram (LEXAPRO) 20 MG tablet, Take 1 tablet (20 mg total) by mouth daily., Disp: 30 tablet, Rfl: 1   fluticasone (FLONASE) 50 MCG/ACT nasal spray, Place 2 sprays into both nostrils daily., Disp: 16 g, Rfl: 6   omeprazole (PRILOSEC) 40 MG capsule, Take 1 capsule (40 mg total) by mouth daily., Disp: 90 capsule, Rfl: 1   sildenafil (VIAGRA) 100 MG tablet, Take 0.5-1 tablets (50-100 mg total) by mouth daily as needed for erectile dysfunction., Disp: 5 tablet, Rfl: 11   Richard IgoBradley L Maudie Shingledecker, DO [facility] 08/09/2019 4:09 PM

## 2019-08-09 NOTE — Progress Notes (Signed)
PFT done today. 

## 2019-08-10 LAB — RESPIRATORY ALLERGY PROFILE REGION II ~~LOC~~
Allergen, A. alternata, m6: <0.1 kU/L
Allergen, Cedar tree, t12: <0.1 kU/L
Allergen, Comm Silver Birch, t9: <0.1 kU/L
Allergen, Cottonwood, t14: <0.1 kU/L
Allergen, D pternoyssinus,d7: <0.1 kU/L
Allergen, Mouse Urine Protein, e78: <0.1 kU/L
Allergen, Mulberry, t76: <0.1 kU/L
Allergen, Oak,t7: <0.1 kU/L
Allergen, P. notatum, m1: <0.1 kU/L
Aspergillus fumigatus, m3: 0.13 kU/L — ABNORMAL HIGH
Bermuda Grass: <0.1 kU/L
Box Elder IgE: <0.1 kU/L
CLADOSPORIUM HERBARUM (M2) IGE: <0.1 kU/L
COMMON RAGWEED (SHORT) (W1) IGE: <0.1 kU/L
Cat Dander: <0.1 kU/L
Class: 0
Class: 0
Class: 0
Class: 0
Class: 0
Class: 0
Class: 0
Class: 0
Class: 0
Class: 0
Class: 0
Class: 0
Class: 0
Class: 0
Class: 0
Class: 0
Class: 0
Class: 0
Class: 0
Class: 0
Class: 0
Class: 0
Class: 0
Cockroach: <0.1 kU/L
D. farinae: <0.1 kU/L
Dog Dander: <0.1 kU/L
Elm IgE: <0.1 kU/L
IgE (Immunoglobulin E), Serum: 125 kU/L — ABNORMAL HIGH (ref <=114)
Johnson Grass: <0.1 kU/L
Pecan/Hickory Tree IgE: <0.1 kU/L
Rough Pigweed  IgE: <0.1 kU/L
Sheep Sorrel IgE: <0.1 kU/L
Timothy Grass: <0.1 kU/L

## 2019-08-10 LAB — CBC WITH DIFFERENTIAL/PLATELET
Basophils Absolute: 0 10*3/uL (ref 0.0–0.1)
Basophils Relative: 0.5 % (ref 0.0–3.0)
Eosinophils Absolute: 0.3 10*3/uL (ref 0.0–0.7)
Eosinophils Relative: 4.2 % (ref 0.0–5.0)
HCT: 41.9 % (ref 39.0–52.0)
Hemoglobin: 13.9 g/dL (ref 13.0–17.0)
Lymphocytes Relative: 40.3 % (ref 12.0–46.0)
Lymphs Abs: 2.7 10*3/uL (ref 0.7–4.0)
MCHC: 33.3 g/dL (ref 30.0–36.0)
MCV: 91.3 fl (ref 78.0–100.0)
Monocytes Absolute: 0.5 10*3/uL (ref 0.1–1.0)
Monocytes Relative: 6.8 % (ref 3.0–12.0)
Neutro Abs: 3.2 10*3/uL (ref 1.4–7.7)
Neutrophils Relative %: 48.2 % (ref 43.0–77.0)
Platelets: 189 10*3/uL (ref 150.0–400.0)
RBC: 4.59 Mil/uL (ref 4.22–5.81)
RDW: 13.6 % (ref 11.5–15.5)
WBC: 6.7 10*3/uL (ref 4.0–10.5)

## 2019-08-10 LAB — INTERPRETATION:

## 2019-08-15 MED FILL — OMEPRAZOLE 40 MG CPDR: 40 | 30 days supply | Qty: 30 | Fill #4

## 2019-08-18 LAB — ALPHA-1 ANTITRYPSIN PHENOTYPE: A-1 Antitrypsin, Ser: 100 mg/dL (ref 83–199)

## 2019-08-20 MED FILL — SILDENAFIL CITRATE 100 MG T: 100 | 30 days supply | Qty: 5 | Fill #2

## 2019-08-22 ENCOUNTER — Other Ambulatory Visit: Payer: Self-pay

## 2019-08-22 ENCOUNTER — Ambulatory Visit (INDEPENDENT_AMBULATORY_CARE_PROVIDER_SITE_OTHER)
Admission: RE | Admit: 2019-08-22 | Discharge: 2019-08-22 | Disposition: A | Discharge status: Home / Self Care | Payer: BC Managed Care – PPO | Source: Ambulatory Visit | Attending: Pulmonary Disease | Admitting: Pulmonary Disease

## 2019-08-22 DIAGNOSIS — J449 Chronic obstructive pulmonary disease, unspecified: Secondary | ICD-10-CM

## 2019-08-22 DIAGNOSIS — R0609 Other forms of dyspnea: Secondary | ICD-10-CM

## 2019-08-22 DIAGNOSIS — R0600 Dyspnea, unspecified: Secondary | ICD-10-CM | POA: Diagnosis not present

## 2019-08-22 DIAGNOSIS — R0602 Shortness of breath: Secondary | ICD-10-CM | POA: Diagnosis not present

## 2019-08-25 MED FILL — ESCITALOPRAM 20 MG TABLET: 20 | 30 days supply | Qty: 30 | Fill #0

## 2019-08-27 ENCOUNTER — Other Ambulatory Visit: Payer: Self-pay

## 2019-08-27 ENCOUNTER — Encounter: Payer: Self-pay | Admitting: Family Medicine

## 2019-08-27 ENCOUNTER — Ambulatory Visit (INDEPENDENT_AMBULATORY_CARE_PROVIDER_SITE_OTHER): Payer: BC Managed Care – PPO | Admitting: Family Medicine

## 2019-08-27 VITALS — BP 136/86 | HR 70 | Temp 97.8°F | Resp 17 | Ht 72.0 in | Wt 253.2 lb

## 2019-08-27 DIAGNOSIS — F411 Generalized anxiety disorder: Secondary | ICD-10-CM

## 2019-08-27 DIAGNOSIS — I10 Essential (primary) hypertension: Secondary | ICD-10-CM

## 2019-08-27 DIAGNOSIS — F410 Panic disorder [episodic paroxysmal anxiety] without agoraphobia: Secondary | ICD-10-CM

## 2019-08-27 DIAGNOSIS — J4540 Moderate persistent asthma, uncomplicated: Secondary | ICD-10-CM

## 2019-08-27 MED ORDER — ALPRAZOLAM 0.5 MG PO TABS: 0.5000 mg | ORAL_TABLET | Freq: Two times a day (BID) | ORAL | 1 refills | Status: DC | PRN

## 2019-08-27 MED ORDER — ESCITALOPRAM OXALATE 20 MG PO TABS: 20.0000 mg | ORAL_TABLET | Freq: Every day | ORAL | 1 refills | Status: DC

## 2019-08-27 MED ORDER — CETIRIZINE HCL 10 MG PO TABS: 10.0000 mg | ORAL_TABLET | Freq: Every day | ORAL | 3 refills | Status: DC

## 2019-08-27 MED ORDER — OMEPRAZOLE 40 MG PO CPDR: 40.0000 mg | DELAYED_RELEASE_CAPSULE | Freq: Every day | ORAL | 3 refills | Status: DC

## 2019-08-27 MED FILL — ALPRAZolam 0.5 MG TABS: 0.5 | 15 days supply | Qty: 30 | Fill #0

## 2019-08-27 NOTE — Patient Instructions (Addendum)
If you have lab work done today you will be contacted with your lab results within the next 2 weeks.  If you have not heard from [place] then please contact us. The fastest way to get your results is to register for My Chart.   IF you received an x-ray today, you will receive an invoice from [facility]. Please contact [facility] at [phone] with questions or concerns regarding your invoice.   IF you received labwork today, you will receive an invoice from [facility]. Please contact [facility] at [phone] with questions or concerns regarding your invoice.   Our billing staff will not be able to assist you with questions regarding bills from these companies.  You will be contacted with the lab results as soon as they are available. The fastest way to get your results is to activate your My Chart account. Instructions are located on the last page of this paperwork. If you have not heard from [place] regarding the results in 2 weeks, please contact this office.     Serum Creatinine Test Why am I having this test? Creatinine is a waste product of normal muscle activity (contraction). Your kidneys filter creatinine from your blood and remove it from your body through urination. This test is a way to measure kidney function. Your creatinine is usually done with other tests of your kidneys (renal function studies). Your health care provider may recommend this test if he or she suspects that you have a condition that is affecting your kidney function. This test may also be done as a part of routine blood work to assess your overall health or to monitor certain medical treatments. Normal blood (serum) creatinine levels depend on the muscle mass of your body. In general, men and people with larger muscle mass will have a slightly higher creatinine level. What is being tested? This test measures the amount of creatinine in your blood. What kind of sample is taken?     A blood sample  is required for this test. It is usually collected by inserting a needle into a blood vessel or by sticking a finger with a small needle. For children, the blood sample is usually collected by sticking the child's heel with a small needle (heel stick). Tell a health care provider about:  All medicines you are taking, including vitamins, herbs, eye drops, creams, and over-the-counter medicines. How are the results reported? Your test results will be reported as a value that indicates the amount of creatinine in your blood. Your health care provider will compare your results to normal ranges that were established after testing a large group of people (reference ranges). Reference ranges may vary among labs and hospitals. For this test, common normal reference ranges are:  Children or adolescents: ? Newborn: 0.3-1.2 mg/dL. ? Infant: 0.2-0.4 mg/dL. ? Child: 0.3-0.7 mg/dL. ? Adolescent: 0.5-1 mg/dL.  Adult male: 0.5-1.1 mg/dL.  Adult male: 0.6-1.2 mg/dL. The reference range may be higher in people who do resistance exercise to increase their muscle mass. Elderly people who have lost muscle mass may have lower values. What do the results mean? Abnormally high levels of serum creatinine can be caused by many health conditions. These may include:  Kidney disease.  Urinary tract obstruction.  Lower-than-normal blood flow to the kidneys.  Kidney damage from the release of molecules into your bloodstream that is caused by muscle damage (rhabdomyolysis).  A condition that causes enlarged bones (acromegaly).  Gigantism.  Diabetes. Abnormally low levels of serum  creatinine can result from decreased muscle mass. Talk with your health care provider about what your results mean. Questions to ask your health care provider Ask your health care provider, or the department that is doing the test:  When will my results be ready?  How will I get my results?  What are my treatment  options?  What other tests do I need?  What are my next steps? Summary  Creatinine is a waste product of normal muscle activity (contraction). Your kidneys filter creatinine from your blood (serum) and remove it through urination.  You may have this test to check whether your kidneys are working as they should. The test may also be used to evaluate your overall health or to monitor certain medical treatments.  Abnormally high levels of serum creatinine can be caused by many health conditions. Abnormally low levels can result from decreased muscle mass.  Talk with your health care provider about what your test results mean. This information is not intended to replace advice given to you by your health care provider. Make sure you discuss any questions you have with your health care provider. Document Revised: 11/07/2017 Document Reviewed: 05/04/2017 Elsevier Patient Education  2020 [employer].

## 2019-08-27 NOTE — Progress Notes (Signed)
Established Patient Office Visit  Subjective:  Patient ID: Richard Hanna, male    DOB: Mar 28, 1976  Age: 44 y.o. MRN: [id number]  CC:  Chief Complaint  Patient presents with  . Medical Management of Chronic Issues    6 week f/u    HPI Richard Hanna presents for   Asthma Pt reports that he has shortness of breath with exercise He states that he had a CT scan that was negative  He is not taking his inhalers for symbicort and trelegy Ellipta due to cost He states that each inhaler was $250.00  Anxiety He reports that he has to wear 2 masks at work - N95 with surgical mask over it. He has lifestyle changes and worrying about work and home He states that he has not had to take the xanax in over a week Since taking the lexapro which helps his anxiety to the point where he keeps the xanax "just incase" but does not need it.  GERD He states that when he runs out of the omeprazole he can tell the difference He had to buy alka seltzer otc  His reflux flares up if he misses the meds He eats late nights when he works the second job.  Hypertension: Patient here for follow-up of elevated blood pressure. He is exercising and is adherent to low salt diet.  Blood pressure is well controlled at home. Cardiac symptoms none. Patient denies chest pain, chest pressure/discomfort, claudication, exertional chest pressure/discomfort and irregular heart beat.  Cardiovascular risk factors: hypertension and obesity (BMI >= 30 kg/m2). Use of agents associated with hypertension: none. History of target organ damage: none. BP Readings from Last 3 Encounters:  08/27/19 136/86  08/09/19 122/80  07/16/19 124/70   Lab Results  Component Value Date   CREATININE 1.39 (H) 02/07/2019     Past Medical History:  Diagnosis Date  . Asthma   . GERD (gastroesophageal reflux disease)   . Headache(784.0)   . Panic attacks     Past Surgical History:  Procedure Laterality Date  . GANGLION CYST EXCISION     L wrist    Family History  Problem Relation Age of Onset  . Hypertension Mother   . Hypertension Father   . Diabetes Father   . Diabetes Paternal Grandfather     Social History   Socioeconomic History  . Marital status: Married    Spouse name: Not on file  . Number of children: Not on file  . Years of education: Not on file  . Highest education level: Not on file  Occupational History  . Not on file  Tobacco Use  . Smoking status: Former Smoker    Packs/day: 1.00    Years: 6.00    Pack years: 6.00    Types: Cigarettes    Quit date: 02/28/2017    Years since quitting: 2.4  . Smokeless tobacco: Never Used  Substance and Sexual Activity  . Alcohol use: Yes  . Drug use: No  . Sexual activity: Yes  Other Topics Concern  . Not on file  Social History Narrative  . Not on file   Social Determinants of Health   Financial Resource Strain:   . Difficulty of Paying Living Expenses: Not on file  Food Insecurity:   . Worried About Charity fundraiser in the Last Year: Not on file  . Ran Out of Food in the Last Year: Not on file  Transportation Needs:   . Lack of Transportation (Medical):  Not on file  . Lack of Transportation (Non-Medical): Not on file  Physical Activity:   . Days of Exercise per Week: Not on file  . Minutes of Exercise per Session: Not on file  Stress:   . Feeling of Stress : Not on file  Social Connections:   . Frequency of Communication with Friends and Family: Not on file  . Frequency of Social Gatherings with Friends and Family: Not on file  . Attends Religious Services: Not on file  . Active Member of Clubs or Organizations: Not on file  . Attends Banker Meetings: Not on file  . Marital Status: Not on file  Intimate Partner Violence:   . Fear of Current or Ex-Partner: Not on file  . Emotionally Abused: Not on file  . Physically Abused: Not on file  . Sexually Abused: Not on file    Outpatient Medications Prior to Visit   Medication Sig Dispense Refill  . budesonide-formoterol (SYMBICORT) 80-4.5 MCG/ACT inhaler Inhale 2 puffs into the lungs 2 (two) times daily. 2 Inhaler 0  . fluticasone (FLONASE) 50 MCG/ACT nasal spray Place 2 sprays into both nostrils daily. 16 g 6  . Fluticasone-Umeclidin-Vilant (TRELEGY ELLIPTA) 200-62.5-25 MCG/INH AEPB Inhale 1 puff into the lungs daily. 1 each 0  . sildenafil (VIAGRA) 100 MG tablet Take 0.5-1 tablets (50-100 mg total) by mouth daily as needed for erectile dysfunction. 5 tablet 11  . ALPRAZolam (XANAX) 0.5 MG tablet Take 1 tablet (0.5 mg total) by mouth 2 (two) times daily as needed for anxiety. 30 tablet 1  . cetirizine (ZYRTEC) 10 MG tablet Take 1 tablet (10 mg total) by mouth daily. 30 tablet 11  . escitalopram (LEXAPRO) 20 MG tablet Take 1 tablet (20 mg total) by mouth daily. 30 tablet 1  . omeprazole (PRILOSEC) 40 MG capsule Take 1 capsule (40 mg total) by mouth daily. 90 capsule 1   No facility-administered medications prior to visit.    No Known Allergies  ROS Review of Systems Review of Systems  Constitutional: Negative for activity change, appetite change, chills and fever.  HENT: Negative for congestion, nosebleeds, trouble swallowing and voice change.   Respiratory: Negative for cough, shortness of breath and wheezing.   Gastrointestinal: Negative for diarrhea, nausea and vomiting.  Genitourinary: Negative for difficulty urinating, dysuria, flank pain and hematuria.  Musculoskeletal: Negative for back pain, joint swelling and neck pain.  Neurological: Negative for dizziness, speech difficulty, light-headedness and numbness.  See HPI. All other review of systems negative.     Objective:    Physical Exam  BP 136/86 (BP Location: Right Arm, Patient Position: Sitting, Cuff Size: Large)   Pulse 70   Temp 97.8 F (36.6 C) (Oral)   Resp 17   Ht 6' (1.829 m)   Wt 253 lb 3.2 oz (114.9 kg)   SpO2 97%   BMI 34.34 kg/m  Wt Readings from Last 3  Encounters:  08/27/19 253 lb 3.2 oz (114.9 kg)  08/09/19 251 lb (113.9 kg)  07/16/19 254 lb (115.2 kg)   Physical Exam  Constitutional: Oriented to person, place, and time. Appears well-developed and well-nourished.  HENT:  Head: Normocephalic and atraumatic.  Eyes: Conjunctivae and EOM are normal.  Cardiovascular: Normal rate, regular rhythm, normal heart sounds and intact distal pulses.  No murmur heard. Pulmonary/Chest: Effort normal and breath sounds normal. No stridor. No respiratory distress. Has no wheezes.  Neurological: Is alert and oriented to person, place, and time.  Skin: Skin is warm.  Capillary refill takes less than 2 seconds.  Psychiatric: Has a normal mood and affect. Behavior is normal. Judgment and thought content normal.    There are no preventive care reminders to display for this patient.  There are no preventive care reminders to display for this patient.  Lab Results  Component Value Date   TSH 1.07 02/07/2019   Lab Results  Component Value Date   WBC 6.7 08/09/2019   HGB 13.9 08/09/2019   HCT 41.9 08/09/2019   MCV 91.3 08/09/2019   PLT 189.0 08/09/2019   Lab Results  Component Value Date   NA 141 02/07/2019   K 4.1 02/07/2019   CO2 26 02/07/2019   GLUCOSE 64 (L) 02/07/2019   BUN 22 02/07/2019   CREATININE 1.39 (H) 02/07/2019   BILITOT 0.7 08/31/2018   ALKPHOS 36 (L) 08/31/2018   AST 18 08/31/2018   ALT 20 08/31/2018   PROT 7.4 08/31/2018   ALBUMIN 4.8 08/31/2018   CALCIUM 9.8 02/07/2019   ANIONGAP 9 05/20/2016   GFR 54.87 (L) 09/27/2018   Lab Results  Component Value Date   CHOL 192 08/31/2018   Lab Results  Component Value Date   HDL 50.90 08/31/2018   Lab Results  Component Value Date   LDLCALC 126 (H) 08/31/2018   Lab Results  Component Value Date   TRIG 72.0 08/31/2018   Lab Results  Component Value Date   CHOLHDL 4 08/31/2018   Lab Results  Component Value Date   HGBA1C 5.5 06/02/2016      Assessment & Plan:    Problem List Items Addressed This Visit      Cardiovascular and Mediastinum   Essential hypertension - Primary Continue stress mgmt No need for meds at this time     Other   Generalized anxiety disorder- continue escitalopram Continue xanax prn    Other Visit Diagnoses    Panic attacks    -  Discussed escitalopram, continue    Moderate persistent extrinsic asthma without complication    -  Discussed calling pulmonology to see if PA can be done or if there are coupons or patient assistance programs.        GERD -  Continue omeprazole   Meds ordered this encounter  Medications  . escitalopram (LEXAPRO) 20 MG tablet    Sig: Take 1 tablet (20 mg total) by mouth daily.    Dispense:  90 tablet    Refill:  1  . ALPRAZolam (XANAX) 0.5 MG tablet    Sig: Take 1 tablet (0.5 mg total) by mouth 2 (two) times daily as needed for anxiety.    Dispense:  30 tablet    Refill:  1  . cetirizine (ZYRTEC) 10 MG tablet    Sig: Take 1 tablet (10 mg total) by mouth daily.    Dispense:  90 tablet    Refill:  3  . omeprazole (PRILOSEC) 40 MG capsule    Sig: Take 1 capsule (40 mg total) by mouth daily.    Dispense:  90 capsule    Refill:  3    Follow-up: No follow-ups on file.    Doristine Bosworth, MD

## 2019-08-28 LAB — CMP14+EGFR
ALT: 17 IU/L (ref 0–44)
AST: 20 IU/L (ref 0–40)
Albumin/Globulin Ratio: 2 (ref 1.2–2.2)
Albumin: 4.5 g/dL (ref 4.0–5.0)
Alkaline Phosphatase: 45 IU/L (ref 39–117)
BUN/Creatinine Ratio: 9 (ref 9–20)
BUN: 14 mg/dL (ref 6–24)
Bilirubin Total: 0.6 mg/dL (ref 0.0–1.2)
CO2: 24 mmol/L (ref 20–29)
Calcium: 9.7 mg/dL (ref 8.7–10.2)
Chloride: 106 mmol/L (ref 96–106)
Creatinine, Ser: 1.51 mg/dL — ABNORMAL HIGH (ref 0.76–1.27)
GFR calc Af Amer: 64 mL/min/{1.73_m2} (ref >59)
GFR calc non Af Amer: 55 mL/min/{1.73_m2} — ABNORMAL LOW (ref >59)
Globulin, Total: 2.3 g/dL (ref 1.5–4.5)
Glucose: 106 mg/dL — ABNORMAL HIGH (ref 65–99)
Potassium: 3.9 mmol/L (ref 3.5–5.2)
Sodium: 145 mmol/L — ABNORMAL HIGH (ref 134–144)
Total Protein: 6.8 g/dL (ref 6.0–8.5)

## 2019-08-28 LAB — LIPID PANEL
Chol/HDL Ratio: 3.1 ratio (ref 0.0–5.0)
Cholesterol, Total: 179 mg/dL (ref 100–199)
HDL: 58 mg/dL (ref >39)
LDL Chol Calc (NIH): 102 mg/dL — ABNORMAL HIGH (ref 0–99)
Triglycerides: 104 mg/dL (ref 0–149)
VLDL Cholesterol Cal: 19 mg/dL (ref 5–40)

## 2019-09-19 MED FILL — ALPRAZolam 0.5 MG TABS: 0.5 | 15 days supply | Qty: 30 | Fill #0

## 2019-09-19 MED FILL — OMEPRAZOLE 40 MG CPDR: 40 | 30 days supply | Qty: 30 | Fill #5

## 2019-09-26 ENCOUNTER — Ambulatory Visit (INDEPENDENT_AMBULATORY_CARE_PROVIDER_SITE_OTHER): Payer: BC Managed Care – PPO | Admitting: Pulmonary Disease

## 2019-09-26 ENCOUNTER — Other Ambulatory Visit: Payer: Self-pay

## 2019-09-26 ENCOUNTER — Ambulatory Visit: Payer: BC Managed Care – PPO | Admitting: Pulmonary Disease

## 2019-09-26 ENCOUNTER — Encounter: Payer: Self-pay | Admitting: Pulmonary Disease

## 2019-09-26 VITALS — BP 140/82 | HR 71 | Temp 98.3°F | Ht 71.0 in | Wt 250.0 lb

## 2019-09-26 DIAGNOSIS — J449 Chronic obstructive pulmonary disease, unspecified: Secondary | ICD-10-CM

## 2019-09-26 DIAGNOSIS — J4530 Mild persistent asthma, uncomplicated: Secondary | ICD-10-CM | POA: Diagnosis not present

## 2019-09-26 DIAGNOSIS — Z77098 Contact with and (suspected) exposure to other hazardous, chiefly nonmedicinal, chemicals: Secondary | ICD-10-CM

## 2019-09-26 DIAGNOSIS — R0609 Other forms of dyspnea: Secondary | ICD-10-CM

## 2019-09-26 DIAGNOSIS — R0600 Dyspnea, unspecified: Secondary | ICD-10-CM

## 2019-09-26 DIAGNOSIS — Z6834 Body mass index (BMI) 34.0-34.9, adult: Secondary | ICD-10-CM

## 2019-09-26 DIAGNOSIS — R768 Other specified abnormal immunological findings in serum: Secondary | ICD-10-CM

## 2019-09-26 DIAGNOSIS — E6609 Other obesity due to excess calories: Secondary | ICD-10-CM

## 2019-09-26 MED ORDER — ALBUTEROL SULFATE (2.5 MG/3ML) 0.083% IN NEBU: 2.5000 mg | INHALATION_SOLUTION | Freq: Four times a day (QID) | RESPIRATORY_TRACT | 12 refills | Status: DC | PRN

## 2019-09-26 MED ORDER — ALBUTEROL SULFATE HFA 108 (90 BASE) MCG/ACT IN AERS: 1.0000 | INHALATION_SPRAY | Freq: Four times a day (QID) | RESPIRATORY_TRACT | 11 refills | Status: DC | PRN

## 2019-09-26 MED ORDER — BREZTRI AEROSPHERE 160-9-4.8 MCG/ACT IN AERO: 2.0000 | INHALATION_SPRAY | Freq: Two times a day (BID) | RESPIRATORY_TRACT | 12 refills | Status: DC

## 2019-09-26 MED ORDER — BREZTRI AEROSPHERE 160-9-4.8 MCG/ACT IN AERO: 2.0000 | INHALATION_SPRAY | Freq: Two times a day (BID) | RESPIRATORY_TRACT | 0 refills | Status: DC

## 2019-09-26 MED FILL — ALBUTEROL 0.083 MG/ML SOLN: (2.5 MG/3ML | 7 days supply | Qty: 90 | Fill #0

## 2019-09-26 MED FILL — ALBUTEROL SULFATE HFA 108 (: 108 (90 BAS | 25 days supply | Qty: 9 | Fill #0

## 2019-09-26 MED FILL — BREZTRI AEROSPHERE 160-9-4.: 160-9-4.8 | 30 days supply | Qty: 11 | Fill #0

## 2019-09-26 NOTE — Patient Instructions (Addendum)
Thank you for visiting Dr. Tonia Brooms at [facility]. Today we recommend the following:  Meds ordered this encounter  Medications  . albuterol (VENTOLIN HFA) 108 (90 Base) MCG/ACT inhaler    Sig: Inhale 1-2 puffs into the lungs every 6 (six) hours as needed for wheezing or shortness of breath.    Dispense:  18 g    Refill:  11  . albuterol (PROVENTIL) (2.5 MG/3ML) 0.083% nebulizer solution    Sig: Take 3 mLs (2.5 mg total) by nebulization every 6 (six) hours as needed for wheezing or shortness of breath.    Dispense:  75 mL    Refill:  12   Samples of breztri inhaler today.   Return in about 6 months (around 03/25/2020).    Please do your part to reduce the spread of COVID-19.

## 2019-09-26 NOTE — Progress Notes (Signed)
Synopsis: Referred in October 2020 for shortness of breath by Doristine Bosworth, MD  Subjective:   PATIENT ID: Richard Hanna GENDER: male DOB: 04/13/1976, MRN: [id number]  Chief Complaint  Patient presents with  . Follow-up    f/u COPD. Breathing is same as last visit no change.     44 year old past medical history of asthma, gastroesophageal reflux disease, headache and panic attacks.  Patient referred for evaluation of shortness of breath. Diagnosed with asthma as a kid. Never hospitalized as a kid. He was told he grew out of it. Born full term. Sister with asthma. Mother and Father no lung issues. Played baseball, basketball in school. 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 Married for 15 years, wife works [employer] as a Engineer, civil (consulting). 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   Weight gain started after stopped smoking. Quit smoking in 2018, [id number], heaviest was smoking 1ppd. Slowly been gaining weight since then. The surgery center closed for covid. He was working out at home. Dr. Beverely Low thought it was related to BB(-). This was stopped.  He is not sure that he see much difference in his symptoms.  Wife complains that he wheezes. Wife says he snores.  Patient is concerned about fatigue.  Not sleeping well.  Erectile dysfunction.  He is currently on medication for this.  Patient states his symptoms were better when he got down to 225 pounds.  Unfortunately he is gained most of this back and is back at 248 pounds today.  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 In 2007 they rountinely mixed without masks, [employer]   OV 08/09/2019: Since last office visit patient was seen by Dr. Wynona Neat for sleep consultation concern for high probability of sleep disordered breathing plans to get him scheduled for a home sleep study.  Patient has also seen his primary care provider being  treated for hypertension and panic attacks.  Last office visit patient was trialed on Symbicort samples.  Here today for follow-up.  Patient had pulmonary function test completed prior to the office visit.  This reveals a decreased ratio with an FEV1 of 62% predicted.  Lung volumes with no evidence of restriction, normal DLCO.  Patient still having dyspnea on exertion.  He is able to complete his activities of daily living.  No known current exposures however please see occupational history above.   OV 09/26/2019: Patient seen today in the office.  Follow-up for recent HRCT imaging of the chest as well as see how is doing with his inhaler regimen.  Unfortunately inhalers are very expensive unable to afford this.  Labs reviewed with an eosinophil count absolute 300, IgE 125.  RAST panel positive for Aspergillus only.  CT HRCT with no evidence of ILD and possibly small airway disease air-trapping.  Viewed all this today in the office.  Patient still has dyspnea on exertion with climbing stairs.  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  Is very short of breath when he gets the top of the steps.  He does feel better with the use of albuterol.   Past Medical History:  Diagnosis Date  . Asthma   . GERD (gastroesophageal reflux disease)   . Headache(784.0)   . Panic attacks      Family History  Problem Relation Age of Onset  .  Hypertension Mother   . Hypertension Father   . Diabetes Father   . Diabetes Paternal Grandfather      Past Surgical History:  Procedure Laterality Date  . GANGLION CYST EXCISION     L wrist    Social History   Socioeconomic History  . Marital status: Married    Spouse name: Not on file  . Number of children: Not on file  . Years of education: Not on file  . Highest education level: Not on file  Occupational History  . Not on file  Tobacco Use  . Smoking status: Former Smoker    Packs/day:  1.00    Years: 6.00    Pack years: 6.00    Types: Cigarettes    Quit date: 02/28/2017    Years since quitting: 2.5  . Smokeless tobacco: Never Used  Substance and Sexual Activity  . Alcohol use: Yes  . Drug use: No  . Sexual activity: Yes  Other Topics Concern  . Not on file  Social History Narrative  . Not on file   Social Determinants of Health   Financial Resource Strain:   . Difficulty of Paying Living Expenses: Not on file  Food Insecurity:   . Worried About Programme researcher, broadcasting/film/video in the Last Year: Not on file  . Ran Out of Food in the Last Year: Not on file  Transportation Needs:   . Lack of Transportation (Medical): Not on file  . Lack of Transportation (Non-Medical): Not on file  Physical Activity:   . Days of Exercise per Week: Not on file  . Minutes of Exercise per Session: Not on file  Stress:   . Feeling of Stress : Not on file  Social Connections:   . Frequency of Communication with Friends and Family: Not on file  . Frequency of Social Gatherings with Friends and Family: Not on file  . Attends Religious Services: Not on file  . Active Member of Clubs or Organizations: Not on file  . Attends Banker Meetings: Not on file  . Marital Status: Not on file  Intimate Partner Violence:   . Fear of Current or Ex-Partner: Not on file  . Emotionally Abused: Not on file  . Physically Abused: Not on file  . Sexually Abused: Not on file     No Known Allergies   Outpatient Medications Prior to Visit  Medication Sig Dispense Refill  . ALPRAZolam (XANAX) 0.5 MG tablet Take 1 tablet (0.5 mg total) by mouth 2 (two) times daily as needed for anxiety. 30 tablet 1  . cetirizine (ZYRTEC) 10 MG tablet Take 1 tablet (10 mg total) by mouth daily. 90 tablet 3  . escitalopram (LEXAPRO) 20 MG tablet Take 1 tablet (20 mg total) by mouth daily. 90 tablet 1  . fluticasone (FLONASE) 50 MCG/ACT nasal spray Place 2 sprays into both nostrils daily. 16 g 6  . omeprazole  (PRILOSEC) 40 MG capsule Take 1 capsule (40 mg total) by mouth daily. 90 capsule 3  . sildenafil (VIAGRA) 100 MG tablet Take 0.5-1 tablets (50-100 mg total) by mouth daily as needed for erectile dysfunction. 5 tablet 11  . budesonide-formoterol (SYMBICORT) 80-4.5 MCG/ACT inhaler Inhale 2 puffs into the lungs 2 (two) times daily. (Patient not taking: Reported on 09/26/2019) 2 Inhaler 0  . Fluticasone-Umeclidin-Vilant (TRELEGY ELLIPTA) 200-62.5-25 MCG/INH AEPB Inhale 1 puff into the lungs daily. (Patient not taking: Reported on 09/26/2019) 1 each 0   No facility-administered medications prior to visit.  Review of Systems  Constitutional: Negative for chills, fever, malaise/fatigue and weight loss.  HENT: Negative for hearing loss, sore throat and tinnitus.   Eyes: Negative for blurred vision and double vision.  Respiratory: Positive for shortness of breath and wheezing. Negative for cough, hemoptysis, sputum production and stridor.   Cardiovascular: Negative for chest pain, palpitations, orthopnea, leg swelling and PND.  Gastrointestinal: Negative for abdominal pain, constipation, diarrhea, heartburn, nausea and vomiting.  Genitourinary: Negative for dysuria, hematuria and urgency.  Musculoskeletal: Negative for joint pain and myalgias.  Skin: Negative for itching and rash.  Neurological: Negative for dizziness, tingling, weakness and headaches.  Endo/Heme/Allergies: Negative for environmental allergies. Does not bruise/bleed easily.  Psychiatric/Behavioral: Negative for depression. The patient is not nervous/anxious and does not have insomnia.   All other systems reviewed and are negative.    Objective:  Physical Exam Vitals reviewed.  Constitutional:      General: He is not in acute distress.    Appearance: He is well-developed.  HENT:     Head: Normocephalic and atraumatic.  Eyes:     General: No scleral icterus.    Conjunctiva/sclera: Conjunctivae normal.     Pupils: Pupils are  equal, round, and reactive to light.  Neck:     Vascular: No JVD.     Trachea: No tracheal deviation.  Cardiovascular:     Rate and Rhythm: Normal rate and regular rhythm.     Heart sounds: Normal heart sounds. No murmur.  Pulmonary:     Effort: Pulmonary effort is normal. No tachypnea, accessory muscle usage or respiratory distress.     Breath sounds: Normal breath sounds. No stridor. No wheezing, rhonchi or rales.  Musculoskeletal:        General: No tenderness.     Cervical back: Neck supple.  Lymphadenopathy:     Cervical: No cervical adenopathy.  Skin:    General: Skin is warm and dry.     Capillary Refill: Capillary refill takes less than 2 seconds.     Findings: No rash.  Neurological:     Mental Status: He is alert and oriented to person, place, and time.  Psychiatric:        Behavior: Behavior normal.      Vitals:   09/26/19 1511  BP: 140/82  Pulse: 71  Temp: 98.3 F (36.8 C)  TempSrc: Temporal  SpO2: 96%  Weight: 250 lb (113.4 kg)  Height: 5\' 11"  (1.803 m)   96% on RA BMI Readings from Last 3 Encounters:  09/26/19 34.87 kg/m  08/27/19 34.34 kg/m  08/09/19 34.04 kg/m   Wt Readings from Last 3 Encounters:  09/26/19 250 lb (113.4 kg)  08/27/19 253 lb 3.2 oz (114.9 kg)  08/09/19 251 lb (113.9 kg)     CBC    Component Value Date/Time   WBC 6.7 08/09/2019 1635   RBC 4.59 08/09/2019 1635   HGB 13.9 08/09/2019 1635   HCT 41.9 08/09/2019 1635   PLT 189.0 08/09/2019 1635   MCV 91.3 08/09/2019 1635   MCH 30.5 02/07/2019 1448   MCHC 33.3 08/09/2019 1635   RDW 13.6 08/09/2019 1635   LYMPHSABS 2.7 08/09/2019 1635   MONOABS 0.5 08/09/2019 1635   EOSABS 0.3 08/09/2019 1635   BASOSABS 0.0 08/09/2019 1635    Chest Imaging: 05/16/2019 chest x-ray: No acute cardiopulmonary findings. The patient's images have been independently reviewed by me.    Pulmonary Functions Testing Results: PFT Results Latest Ref Rng & Units 08/09/2019  FVC-Pre L 4.02  FVC-Predicted Pre % 87  FVC-Post L 4.24  FVC-Predicted Post % 91  Pre FEV1/FVC % % 55  Post FEV1/FCV % % 55  FEV1-Pre L 2.23  FEV1-Predicted Pre % 59  FEV1-Post L 2.33  DLCO UNC% % 93  DLCO COR %Predicted % 106  TLC L 6.61  TLC % Predicted % 89  RV % Predicted % 100    FeNO: None   Pathology: None   Echocardiogram: None   Heart Catheterization: None     Assessment & Plan:     ICD-10-CM   1. Moderate COPD (chronic obstructive pulmonary disease) (HCC)  J44.9   2. DOE (dyspnea on exertion)  R06.00   3. Occupational exposure to chemicals  Z77.098   4. Mild persistent asthma without complication  L97.67   5. Class 1 obesity due to excess calories without serious comorbidity with body mass index (BMI) of 34.0 to 34.9 in adult  E66.09    Z68.34   6. Elevated IgE level  R76.8     Discussion: This is a 44 year old gentleman past medical history of asthma as a child, labeled with exercise-induced asthma when he was in the [employer].  He has PFTs with moderate obstructive lung disease.  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  HRCT with no evidence of ILD however there is evidence of airtrapping possible small airway disease. I question if we could be missing the dx of BO.   Assessment:   Chronic obstructive lung disease, likely airway remodeling secondary to asthma, persistent progressive symptoms despite therapy.  At this point unable to afford inhalers which is likely making some of his symptomatology worse.  Plan Following Extensive Data Review & Interpretation:  . I reviewed prior external note(s) from 08/27/2019 primary care, Dr. Nolon Rod gastroesophageal reflux hypertension anxiety asthma. . I reviewed the result(s) of IgE 125, RAST panel positive for Aspergillus antigen, absolute eosinophil count 300 . I have ordered new prescription for breztri, also give zero  dollar copay card as well as prescription   Independent interpretation of tests . Review of patient's 08/22/2019 high-resolution CT scan images revealed evidence of mild air trapping possible small airway disease.  No infiltrate, no bronchiectasis no evidence of ILD.Marland Kitchen The patient's images have been independently reviewed by me.    Discussion of management with Dr. Carlis Abbott, pulmonary colleague to review case as second opinion on day of office visit.  [facility] 09/26/2019 3:28 PM

## 2019-10-25 MED FILL — OMEPRAZOLE 40 MG CPDR: 40 | 30 days supply | Qty: 30 | Fill #0

## 2019-11-26 ENCOUNTER — Other Ambulatory Visit: Payer: Self-pay

## 2019-11-26 ENCOUNTER — Telehealth: Payer: BC Managed Care – PPO | Admitting: Family Medicine

## 2019-11-27 ENCOUNTER — Encounter: Payer: Self-pay | Admitting: Family Medicine

## 2019-12-24 ENCOUNTER — Encounter: Payer: Self-pay | Admitting: Family Medicine

## 2019-12-24 NOTE — Telephone Encounter (Signed)
PCC's can we reschedule HST?

## 2020-01-07 ENCOUNTER — Encounter (HOSPITAL_COMMUNITY): Payer: Self-pay | Admitting: Emergency Medicine

## 2020-01-07 ENCOUNTER — Emergency Department (HOSPITAL_COMMUNITY)
Admission: EM | Admit: 2020-01-07 | Discharge: 2020-01-07 | Disposition: A | Discharge status: Home / Self Care | Payer: BC Managed Care – PPO | Attending: Emergency Medicine | Admitting: Emergency Medicine

## 2020-01-07 ENCOUNTER — Other Ambulatory Visit: Payer: Self-pay

## 2020-01-07 DIAGNOSIS — Z79899 Other long term (current) drug therapy: Secondary | ICD-10-CM | POA: Insufficient documentation

## 2020-01-07 DIAGNOSIS — Z87891 Personal history of nicotine dependence: Secondary | ICD-10-CM | POA: Diagnosis not present

## 2020-01-07 DIAGNOSIS — I10 Essential (primary) hypertension: Secondary | ICD-10-CM | POA: Diagnosis not present

## 2020-01-07 DIAGNOSIS — R42 Dizziness and giddiness: Secondary | ICD-10-CM | POA: Diagnosis not present

## 2020-01-07 DIAGNOSIS — J45909 Unspecified asthma, uncomplicated: Secondary | ICD-10-CM | POA: Diagnosis not present

## 2020-01-07 HISTORY — DX: Essential (primary) hypertension: I10

## 2020-01-07 LAB — BASIC METABOLIC PANEL
Anion gap: 12 (ref 5–15)
BUN: 17 mg/dL (ref 6–20)
CO2: 24 mmol/L (ref 22–32)
Calcium: 9.4 mg/dL (ref 8.9–10.3)
Chloride: 104 mmol/L (ref 98–111)
Creatinine, Ser: 1.46 mg/dL — ABNORMAL HIGH (ref 0.61–1.24)
GFR calc Af Amer: >60 mL/min (ref >60)
GFR calc non Af Amer: 58 mL/min — ABNORMAL LOW (ref >60)
Glucose, Bld: 106 mg/dL — ABNORMAL HIGH (ref 70–99)
Potassium: 3.8 mmol/L (ref 3.5–5.1)
Sodium: 140 mmol/L (ref 135–145)

## 2020-01-07 LAB — CBC WITH DIFFERENTIAL/PLATELET
Abs Immature Granulocytes: 0.01 10*3/uL (ref 0.00–0.07)
Basophils Absolute: 0.1 10*3/uL (ref 0.0–0.1)
Basophils Relative: 1 %
Eosinophils Absolute: 0.2 10*3/uL (ref 0.0–0.5)
Eosinophils Relative: 3 %
HCT: 49.1 % (ref 39.0–52.0)
Hemoglobin: 16.4 g/dL (ref 13.0–17.0)
Immature Granulocytes: 0 %
Lymphocytes Relative: 24 %
Lymphs Abs: 1.6 10*3/uL (ref 0.7–4.0)
MCH: 30 pg (ref 26.0–34.0)
MCHC: 33.4 g/dL (ref 30.0–36.0)
MCV: 89.9 fL (ref 80.0–100.0)
Monocytes Absolute: 0.4 10*3/uL (ref 0.1–1.0)
Monocytes Relative: 7 %
Neutro Abs: 4.2 10*3/uL (ref 1.7–7.7)
Neutrophils Relative %: 65 %
Platelets: 232 10*3/uL (ref 150–400)
RBC: 5.46 MIL/uL (ref 4.22–5.81)
RDW: 13.2 % (ref 11.5–15.5)
WBC: 6.4 10*3/uL (ref 4.0–10.5)
nRBC: 0 % (ref 0.0–0.2)

## 2020-01-07 NOTE — ED Triage Notes (Signed)
Pt c/o high blood pressure and dizziness esp with standing since yesterday. Reports hx HTN and was taken off propanolol last year by PCP and was never started on anything else.

## 2020-01-07 NOTE — ED Provider Notes (Signed)
` Richard Hanna Provider Note   CSN: [id number] Arrival date & time: 01/07/20  0750     History Chief Complaint  Patient presents with  . Dizziness  . Hypertension    Richard Hanna is a 44 y.o. male w PMHx HTN, GERD, GAD, asthma, presenting to the ED with complaint of high blood pressure with dizziness.  Patient states last year he was taken off of propranolol due to unable to tolerate the side effects.  He was prescribed this by his PCP for hypertension.  He states his high blood pressure is usually around 140 systolic.  He reports yesterday he began feeling anxious and lightheaded, noting his blood pressure to be around 170 systolic.  He states he felt some tingling sensation in his extremities during that time, though took the time to calm down and his symptoms resolved.  He reports his vision may have been a little bit blurry bilaterally during this time as well.  Denies associated headache, chest pain, shortness of breath, unilateral weakness, speech difficulty.  He reports this morning he had a similar episode and noted his blood pressure to be around 180 systolic.  He states he has a new PCP appointment on 01/15/2020 as his current PCP has left the practice.  He has not been on blood pressure medication in about a year.   No current symptoms at this time in the ED.  The history is provided by the patient.       Past Medical History:  Diagnosis Date  . Asthma   . GERD (gastroesophageal reflux disease)   . Headache(784.0)   . Hypertension   . Panic attacks     Patient Active Problem List   Diagnosis Date Noted  . Obesity (BMI 30.0-34.9) 08/31/2018  . ED (erectile dysfunction) 08/31/2018  . OSA (obstructive sleep apnea) 12/22/2016  . Essential hypertension 06/02/2016  . Toenail fungus 02/04/2016  . Generalized anxiety disorder 03/06/2013  . Allergic rhinitis 10/14/2010  . G E R D 09/22/2010  . HEADACHE, CHRONIC 09/22/2010  . Asthma  08/28/2010    Past Surgical History:  Procedure Laterality Date  . GANGLION CYST EXCISION     L wrist       Family History  Problem Relation Age of Onset  . Hypertension Mother   . Hypertension Father   . Diabetes Father   . Diabetes Paternal Grandfather     Social History   Tobacco Use  . Smoking status: Former Smoker    Packs/day: 1.00    Years: 6.00    Pack years: 6.00    Types: Cigarettes    Quit date: 02/28/2017    Years since quitting: 2.8  . Smokeless tobacco: Never Used  Substance Use Topics  . Alcohol use: Yes  . Drug use: No    Home Medications Prior to Admission medications   Medication Sig Start Date End Date Taking? Authorizing Provider  albuterol (PROVENTIL) (2.5 MG/3ML) 0.083% nebulizer solution Take 3 mLs (2.5 mg total) by nebulization every 6 (six) hours as needed for wheezing or shortness of breath. 09/26/19  Yes Icard, Bradley L, DO  albuterol (VENTOLIN HFA) 108 (90 Base) MCG/ACT inhaler Inhale 1-2 puffs into the lungs every 6 (six) hours as needed for wheezing or shortness of breath. 09/26/19  Yes Icard, Rachel Bo, DO  Budeson-Glycopyrrol-Formoterol (BREZTRI AEROSPHERE) 160-9-4.8 MCG/ACT AERO Inhale 2 puffs into the lungs 2 (two) times daily. Patient taking differently: Inhale 2 puffs into the lungs daily.  09/26/19  Yes Icard, Bradley L, DO  cetirizine (ZYRTEC) 10 MG tablet Take 1 tablet (10 mg total) by mouth daily. 08/27/19  Yes Stallings, Zoe A, MD  fluticasone (FLONASE) 50 MCG/ACT nasal spray Place 2 sprays into both nostrils daily. 10/23/18  Yes Sheliah Hatch, MD  omeprazole (PRILOSEC) 40 MG capsule Take 1 capsule (40 mg total) by mouth daily. 08/27/19  Yes Doristine Bosworth, MD  sildenafil (VIAGRA) 100 MG tablet Take 0.5-1 tablets (50-100 mg total) by mouth daily as needed for erectile dysfunction. 08/31/18  Yes Sheliah Hatch, MD  ALPRAZolam Prudy Feeler) 0.5 MG tablet Take 1 tablet (0.5 mg total) by mouth 2 (two) times daily as needed for anxiety.  Patient not taking: Reported on 01/07/2020 08/27/19   Doristine Bosworth, MD  Budeson-Glycopyrrol-Formoterol (BREZTRI AEROSPHERE) 160-9-4.8 MCG/ACT AERO Inhale 2 puffs into the lungs 2 (two) times daily. Patient not taking: Reported on 01/07/2020 09/26/19   Audie Box L, DO  budesonide-formoterol (SYMBICORT) 80-4.5 MCG/ACT inhaler Inhale 2 puffs into the lungs 2 (two) times daily. Patient not taking: Reported on 09/26/2019 06/07/19   Icard, Elige Radon L, DO  escitalopram (LEXAPRO) 20 MG tablet Take 1 tablet (20 mg total) by mouth daily. Patient not taking: Reported on 01/07/2020 08/27/19   Doristine Bosworth, MD  Fluticasone-Umeclidin-Vilant (TRELEGY ELLIPTA) 200-62.5-25 MCG/INH AEPB Inhale 1 puff into the lungs daily. Patient not taking: Reported on 09/26/2019 08/09/19   Josephine Igo, DO    Allergies    Patient has no known allergies.  Review of Systems   Review of Systems  All other systems reviewed and are negative.   Physical Exam Updated Vital Signs BP (!) 136/91 (BP Location: Right Arm)   Pulse 65   Temp 98.2 F (36.8 C) (Oral)   Resp 18   SpO2 100%   Physical Exam Vitals and nursing note reviewed.  Constitutional:      General: He is not in acute distress.    Appearance: He is well-developed. He is not ill-appearing.  HENT:     Head: Normocephalic and atraumatic.  Eyes:     Conjunctiva/sclera: Conjunctivae normal.  Cardiovascular:     Rate and Rhythm: Normal rate and regular rhythm.  Pulmonary:     Effort: Pulmonary effort is normal. No respiratory distress.     Breath sounds: Normal breath sounds.  Abdominal:     Palpations: Abdomen is soft.  Musculoskeletal:     Right lower leg: No edema.     Left lower leg: No edema.  Skin:    General: Skin is warm.  Neurological:     Mental Status: He is alert.     Comments: Mental Status:  Alert, oriented, thought content appropriate, able to give a coherent history. Speech fluent without evidence of aphasia. Able to follow 2 step  commands without difficulty.  Cranial Nerves:  II:  Peripheral visual fields grossly normal, pupils equal, round, reactive to light III,IV, VI: ptosis not present, extra-ocular motions intact bilaterally  V,VII: smile symmetric, facial light touch sensation equal VIII: hearing grossly normal to voice  X: uvula elevates symmetrically  XI: bilateral shoulder shrug symmetric and strong XII: midline tongue extension without fassiculations Motor:  Normal tone. 5/5 strength in upper and lower extremities bilaterally including strong and equal grip strength and dorsiflexion/plantar flexion Sensory: grossly normal in all extremities.  Cerebellar: normal finger-to-nose with bilateral upper extremities Gait: normal gait and balance CV: distal pulses palpable throughout    Psychiatric:  Behavior: Behavior normal.     ED Results / Procedures / Treatments   Labs (all labs ordered are listed, but only abnormal results are displayed) Labs Reviewed  BASIC METABOLIC PANEL - Abnormal; Notable for the following components:      Result Value   Glucose, Bld 106 (*)    Creatinine, Ser 1.46 (*)    GFR calc non Af Amer 58 (*)    All other components within normal limits  CBC WITH DIFFERENTIAL/PLATELET    EKG None  Radiology No results found.  Procedures Procedures (including critical care time)  Medications Ordered in ED Medications - No data to display  ED Course  I have reviewed the triage vital signs and the nursing notes.  Pertinent labs & imaging results that were available during my care of the patient were reviewed by me and considered in my medical decision making (see chart for details).    MDM Rules/Calculators/A&P                      Patient presenting with episodes of high blood pressure at home as well as some anxiety.  He reports some feeling of lightheadedness during these episodes yesterday and today though no unilateral weakness, speech changes, headache, chest  pain or shortness of breath.  He not report any current symptoms in the ED on evaluation today.  Normal heart and lung exam.  No focal neuro deficits.  Blood pressure is 140/100.  Will check renal function.  Patient's renal function appears to be at baseline.  No evidence of emergent pathology at this time.  Recommend log his blood pressures over the next 2 weeks to bring to his PCP appointment at the end of the month.  Discussed symptoms that will require reevaluation in the ED. patient agreeable plan and safe for discharge.  .Discussed results, findings, treatment and follow up. Patient advised of return precautions. Patient verbalized understanding and agreed with plan.  Final Clinical Impression(s) / ED Diagnoses Final diagnoses:  Hypertension, unspecified type    Rx / DC Orders ED Discharge Orders    None       Robinson, [place] N, PA-C 01/07/20 1040    Charlesetta Shanks, MD 01/08/20 [phone]

## 2020-01-07 NOTE — Discharge Instructions (Signed)
Please begin documenting a blood pressure log daily to bring to your new primary care appointment this month. Reference the written materials provided to help adjust your diet to help aid in blood pressure control. Return to the emergency department if you develop severe headache, loss loss of or double vision, weakness in your extremities, chest pain, or new or concerning symptoms.

## 2020-01-15 ENCOUNTER — Ambulatory Visit: Payer: BC Managed Care – PPO

## 2020-01-15 ENCOUNTER — Other Ambulatory Visit: Payer: Self-pay

## 2020-01-15 DIAGNOSIS — L608 Other nail disorders: Secondary | ICD-10-CM | POA: Diagnosis not present

## 2020-01-15 DIAGNOSIS — R0683 Snoring: Secondary | ICD-10-CM | POA: Diagnosis not present

## 2020-01-15 DIAGNOSIS — I10 Essential (primary) hypertension: Secondary | ICD-10-CM | POA: Diagnosis not present

## 2020-01-15 DIAGNOSIS — F419 Anxiety disorder, unspecified: Secondary | ICD-10-CM | POA: Diagnosis not present

## 2020-01-15 DIAGNOSIS — G4733 Obstructive sleep apnea (adult) (pediatric): Secondary | ICD-10-CM

## 2020-01-15 DIAGNOSIS — E663 Overweight: Secondary | ICD-10-CM | POA: Diagnosis not present

## 2020-01-15 MED FILL — OLMESARTAN-HCTZ 40-12.5 MG: 40-12.5 | 30 days supply | Qty: 30 | Fill #0

## 2020-01-22 ENCOUNTER — Telehealth: Payer: Self-pay | Admitting: Pulmonary Disease

## 2020-01-22 DIAGNOSIS — G4733 Obstructive sleep apnea (adult) (pediatric): Secondary | ICD-10-CM | POA: Diagnosis not present

## 2020-01-22 NOTE — Telephone Encounter (Signed)
Call patient  Sleep study result  Date of study: 01/15/2020  Impression: Negative study for significant sleep disordered breathing  Recommendation: Sleep position optimization by encouraging sleeping in a lateral position, elevating head of bed by about 30 degrees may help noted snoring  Encouraged weight loss and exercise  Caution against sleepy driving  Follow-up in 4 to 6 weeks 

## 2020-01-23 NOTE — Telephone Encounter (Signed)
Pt was scheduled for HST on 5/25.  Can you please close out this message?  PCCs are not able to close these messages.

## 2020-01-23 NOTE — Telephone Encounter (Signed)
Called and left message for patient to return call.  

## 2020-01-24 NOTE — Telephone Encounter (Signed)
Dr. Wynona Neat has reviewed the home sleep test this test was negative for sleep apnea.  Impressions Negative study for significant sleep disordered breathing No significant oxygen desaturations  Recommendations Sleep position optimization by encouraging sleep in the lateral position, elevating the head of bed by about 30 degrees may help noted snoring Encourage regular exercise and weight loss effort Caution against driving when sleepy and against medications with sedative side effects.  Patient contacted and results of home sleep study reviewed. Patient verbalized understanding of results. He has follow up with Dr. Tonia Brooms later this year and will call for appointment with Dr. Wynona Neat as needed.

## 2020-02-12 DIAGNOSIS — N529 Male erectile dysfunction, unspecified: Secondary | ICD-10-CM | POA: Diagnosis not present

## 2020-02-12 DIAGNOSIS — E663 Overweight: Secondary | ICD-10-CM | POA: Diagnosis not present

## 2020-02-12 DIAGNOSIS — F419 Anxiety disorder, unspecified: Secondary | ICD-10-CM | POA: Diagnosis not present

## 2020-02-12 DIAGNOSIS — I10 Essential (primary) hypertension: Secondary | ICD-10-CM | POA: Diagnosis not present

## 2020-02-12 DIAGNOSIS — J45909 Unspecified asthma, uncomplicated: Secondary | ICD-10-CM | POA: Diagnosis not present

## 2020-02-12 MED FILL — ALBUTEROL SULFATE HFA 108 (: 108 (90 BAS | 25 days supply | Qty: 9 | Fill #0

## 2020-02-12 MED FILL — OMEPRAZOLE 40 MG CPDR: 40 | 30 days supply | Qty: 30 | Fill #0

## 2020-03-10 MED FILL — OLMESARTAN-HCTZ 40-12.5 MG: 40-12.5 | 30 days supply | Qty: 30 | Fill #1

## 2020-03-10 MED FILL — OMEPRAZOLE 40 MG CPDR: 40 | 30 days supply | Qty: 30 | Fill #1

## 2020-04-29 ENCOUNTER — Other Ambulatory Visit: Payer: Self-pay | Admitting: Family Medicine

## 2020-04-29 MED FILL — OMEPRAZOLE 40 MG CPDR: 40 | 30 days supply | Qty: 30 | Fill #2

## 2020-04-29 MED FILL — OLMESARTAN-HCTZ 40-12.5 MG: 40-12.5 | 30 days supply | Qty: 30 | Fill #2

## 2020-04-30 NOTE — Telephone Encounter (Signed)
Please advise you are not listed as PCP.

## 2020-05-01 ENCOUNTER — Other Ambulatory Visit (HOSPITAL_COMMUNITY): Payer: Self-pay | Admitting: Family Medicine

## 2020-05-01 MED FILL — SILDENAFIL CITRATE 100 MG T: 100 | 30 days supply | Qty: 6 | Fill #0

## 2020-05-13 DIAGNOSIS — Z20822 Contact with and (suspected) exposure to covid-19: Secondary | ICD-10-CM | POA: Diagnosis not present

## 2020-06-11 MED FILL — OMEPRAZOLE 40 MG CPDR: 40 | 30 days supply | Qty: 30 | Fill #3

## 2020-06-11 MED FILL — OLMESARTAN-HCTZ 40-12.5 MG: 40-12.5 | 30 days supply | Qty: 30 | Fill #0

## 2020-07-22 ENCOUNTER — Other Ambulatory Visit: Payer: Self-pay | Admitting: Family Medicine

## 2020-07-22 MED FILL — OMEPRAZOLE 40 MG CPDR: 40 | 30 days supply | Qty: 30 | Fill #4

## 2020-07-22 MED FILL — OLMESARTAN-HCTZ 40-12.5 MG: 40-12.5 | 30 days supply | Qty: 30 | Fill #1

## 2020-07-22 MED FILL — SILDENAFIL CITRATE 100 MG T: 100 | 30 days supply | Qty: 6 | Fill #1

## 2020-07-23 ENCOUNTER — Other Ambulatory Visit (HOSPITAL_COMMUNITY): Payer: Self-pay | Admitting: Family Medicine

## 2020-07-23 MED FILL — TERBINAFINE HCL 250 MG TAB: 250 | 30 days supply | Qty: 30 | Fill #0

## 2020-09-03 ENCOUNTER — Other Ambulatory Visit: Payer: BC Managed Care – PPO

## 2020-09-03 DIAGNOSIS — Z1152 Encounter for screening for COVID-19: Secondary | ICD-10-CM | POA: Diagnosis not present

## 2020-09-11 ENCOUNTER — Other Ambulatory Visit (HOSPITAL_COMMUNITY): Payer: Self-pay | Admitting: Family Medicine

## 2020-09-11 MED FILL — OMEPRAZOLE 40 MG CPDR: 40 | 30 days supply | Qty: 30 | Fill #0

## 2020-09-24 ENCOUNTER — Other Ambulatory Visit: Payer: Self-pay

## 2020-09-24 ENCOUNTER — Ambulatory Visit (INDEPENDENT_AMBULATORY_CARE_PROVIDER_SITE_OTHER): Payer: BC Managed Care – PPO | Admitting: Family Medicine

## 2020-09-24 ENCOUNTER — Encounter: Payer: Self-pay | Admitting: Family Medicine

## 2020-09-24 VITALS — BP 110/70 | HR 66 | Ht 72.25 in | Wt 245.6 lb

## 2020-09-24 DIAGNOSIS — N529 Male erectile dysfunction, unspecified: Secondary | ICD-10-CM | POA: Diagnosis not present

## 2020-09-24 DIAGNOSIS — Z125 Encounter for screening for malignant neoplasm of prostate: Secondary | ICD-10-CM

## 2020-09-24 DIAGNOSIS — Z833 Family history of diabetes mellitus: Secondary | ICD-10-CM | POA: Diagnosis not present

## 2020-09-24 DIAGNOSIS — J4540 Moderate persistent asthma, uncomplicated: Secondary | ICD-10-CM

## 2020-09-24 DIAGNOSIS — K219 Gastro-esophageal reflux disease without esophagitis: Secondary | ICD-10-CM | POA: Diagnosis not present

## 2020-09-24 DIAGNOSIS — E669 Obesity, unspecified: Secondary | ICD-10-CM | POA: Diagnosis not present

## 2020-09-24 DIAGNOSIS — Z23 Encounter for immunization: Secondary | ICD-10-CM

## 2020-09-24 DIAGNOSIS — Z1211 Encounter for screening for malignant neoplasm of colon: Secondary | ICD-10-CM

## 2020-09-24 DIAGNOSIS — J449 Chronic obstructive pulmonary disease, unspecified: Secondary | ICD-10-CM | POA: Diagnosis not present

## 2020-09-24 DIAGNOSIS — I10 Essential (primary) hypertension: Secondary | ICD-10-CM | POA: Diagnosis not present

## 2020-09-24 LAB — POCT URINALYSIS DIP (PROADVANTAGE DEVICE)
Bilirubin, UA: NEGATIVE
Blood, UA: NEGATIVE
Glucose, UA: NEGATIVE mg/dL
Ketones, POC UA: NEGATIVE mg/dL
Leukocytes, UA: NEGATIVE
Nitrite, UA: NEGATIVE
Protein Ur, POC: NEGATIVE mg/dL
Specific Gravity, Urine: 1.02
Urobilinogen, Ur: 0.2
pH, UA: 6 (ref 5.0–8.0)

## 2020-09-24 NOTE — Progress Notes (Signed)
Subjective:    Patient ID: Richard Hanna, male    DOB: 1975-12-04, 45 y.o.   MRN: [id number]  HPI Chief Complaint  Patient presents with  . new pt    New pt, would like testosterone check- having some problems, history family of prostate issues- so just wants it checked   He is new to the practice and here to establish care.   Other providers: Pulmonologist- Dr. Tonia Brooms   States he was diagnosed with exercise induced asthma in [employer].   States he had a sleep study and he did not have sleep apnea.   Asthma - states he is having symptoms such as shortness of breath but does not have an inhaler. He has not been able to exercise recently due to shortness of breath.   Pulmonology testing showed moderate COPD and he was prescribed several different medications but states they were not affordable. He did not let Dr. Tonia Brooms know this.   Former smoker. Stopped in 2017. Smoked 12 years.  States he stopped smoking and he gained weight. Approximately 40-50 lbs but he has lost some of that and is working on his weight.   ED- states he is having issues maintaining an erection. Reports normal libido. Occasionally has issues getting an erection even with Viagra. States the medication used to work well for him but not in the recent few weeks. He is concerned.  No family history of prostate cancer.   No urinary symptoms   Family history of diabetes.    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  He was in [employer] for 8 years.   Immunizations: needs Covid booster.   Health maintenance:  Colonoscopy: never  Last PSA: never    Reviewed allergies, medications, past medical, surgical, family, and social history.    Review of Systems Pertinent positives and negatives in the history of present illness.     Objective:   Physical Exam BP 110/70   Pulse 66   Ht 6'  0.25" (1.835 m)   Wt 245 lb 9.6 oz (111.4 kg)   BMI 33.08 kg/m   Alert and in no distress. Cardiac exam shows a regular sinus rhythm without murmurs or gallops. Lungs are clear to auscultation. No LE edema. Skin is warm and dry. Normal mood.        Assessment & Plan:  Moderate COPD (chronic obstructive pulmonary disease) (HCC) - Plan: Ambulatory referral to Pulmonology -He is a pleasant 45 year old male who is new to the practice and here to establish care.  Reviewed previous medical history and notes from pulmonologist.  Richard Hanna was on his medication list however he reports that it was not affordable.  He did not follow-up with his pulmonologist about this.  I happened to have samples in my office today of this medication.  A months worth of Richard Hanna was provided. I also provided him with an albuterol inhaler sample for one month.  He will follow-up with Dr. Tonia Brooms and pulmonology.  Moderate persistent extrinsic asthma without complication - Plan: Ambulatory referral to Pulmonology -albuterol inhaler provided. Urgent referral back to Dr. Tonia Brooms.   Gastroesophageal reflux disease without esophagitis - Plan: Ambulatory referral to Gastroenterology -continue taking PPI. Avoid triggers.   Essential hypertension - Plan: CBC with Differential/Platelet, Comprehensive metabolic panel -controlled. Low sodium diet and continue medication   Erectile dysfunction, unspecified  erectile dysfunction type - Plan: PSA, Testosterone, Ambulatory referral to Urology -Viagra no longer working per patient. Check labs and refer to urology  Family history of diabetes mellitus in father - Plan: POCT Urinalysis DIP (Proadvantage Device), Hemoglobin A1c  Obesity (BMI 30.0-34.9) - Plan: Hemoglobin A1c -counseling on healthy diet and exercise. Exercise has been limited due to asthma and CoPD  Screening for prostate cancer - Plan: PSA, Ambulatory referral to Urology  Screen for colon cancer - Plan: Ambulatory  referral to Gastroenterology -he is due for screening   Need for COVID-19 vaccine - Plan: PFIZER Comirnaty(GRAY TOP)COVID-19 Vaccine

## 2020-09-24 NOTE — Patient Instructions (Signed)
Start back on the daily maintenance inhaler as discussed.  Rinse her mouth out after each use.  Use albuterol as needed every 4-6 hours.  I am referring you back to your pulmonologist for further evaluation and treatment.  I am referring you to [facility] and they will call you to schedule.  You will also hear from [facility] gastroenterology to schedule a visit to discuss your for screening colonoscopy and acid reflux.  We will be in touch with your lab results.

## 2020-09-25 ENCOUNTER — Encounter: Payer: Self-pay | Admitting: Family Medicine

## 2020-09-25 DIAGNOSIS — N189 Chronic kidney disease, unspecified: Secondary | ICD-10-CM

## 2020-09-25 HISTORY — DX: Chronic kidney disease, unspecified: N18.9

## 2020-09-25 LAB — CBC WITH DIFFERENTIAL/PLATELET
Basophils Absolute: 0.1 10*3/uL (ref 0.0–0.2)
Basos: 1 %
EOS (ABSOLUTE): 0.3 10*3/uL (ref 0.0–0.4)
Eos: 5 %
Hematocrit: 42 % (ref 37.5–51.0)
Hemoglobin: 14.2 g/dL (ref 13.0–17.7)
Immature Grans (Abs): 0 10*3/uL (ref 0.0–0.1)
Immature Granulocytes: 0 %
Lymphocytes Absolute: 2.3 10*3/uL (ref 0.7–3.1)
Lymphs: 35 %
MCH: 30.5 pg (ref 26.6–33.0)
MCHC: 33.8 g/dL (ref 31.5–35.7)
MCV: 90 fL (ref 79–97)
Monocytes Absolute: 0.5 10*3/uL (ref 0.1–0.9)
Monocytes: 8 %
Neutrophils Absolute: 3.4 10*3/uL (ref 1.4–7.0)
Neutrophils: 51 %
Platelets: 200 10*3/uL (ref 150–450)
RBC: 4.66 x10E6/uL (ref 4.14–5.80)
RDW: 13 % (ref 11.6–15.4)
WBC: 6.6 10*3/uL (ref 3.4–10.8)

## 2020-09-25 LAB — COMPREHENSIVE METABOLIC PANEL
ALT: 22 IU/L (ref 0–44)
AST: 22 IU/L (ref 0–40)
Albumin/Globulin Ratio: 1.8 (ref 1.2–2.2)
Albumin: 4.6 g/dL (ref 4.0–5.0)
Alkaline Phosphatase: 44 IU/L (ref 44–121)
BUN/Creatinine Ratio: 11 (ref 9–20)
BUN: 17 mg/dL (ref 6–24)
Bilirubin Total: 0.2 mg/dL (ref 0.0–1.2)
CO2: 24 mmol/L (ref 20–29)
Calcium: 9.5 mg/dL (ref 8.7–10.2)
Chloride: 103 mmol/L (ref 96–106)
Creatinine, Ser: 1.61 mg/dL — ABNORMAL HIGH (ref 0.76–1.27)
GFR calc Af Amer: 59 mL/min/{1.73_m2} — ABNORMAL LOW (ref >59)
GFR calc non Af Amer: 51 mL/min/{1.73_m2} — ABNORMAL LOW (ref >59)
Globulin, Total: 2.5 g/dL (ref 1.5–4.5)
Glucose: 96 mg/dL (ref 65–99)
Potassium: 4.4 mmol/L (ref 3.5–5.2)
Sodium: 141 mmol/L (ref 134–144)
Total Protein: 7.1 g/dL (ref 6.0–8.5)

## 2020-09-25 LAB — HEMOGLOBIN A1C
Est. average glucose Bld gHb Est-mCnc: 123 mg/dL
Hgb A1c MFr Bld: 5.9 % — ABNORMAL HIGH (ref 4.8–5.6)

## 2020-09-25 LAB — TESTOSTERONE: Testosterone: 331 ng/dL (ref 264–916)

## 2020-09-25 LAB — PSA: Prostate Specific Ag, Serum: 1.4 ng/mL (ref 0.0–4.0)

## 2020-09-25 NOTE — Progress Notes (Signed)
He has prediabetes.  I recommend that he cut back significantly on sugar and carbohydrates such as potatoes, pasta, rice, bread and increase his physical activity as tolerated.  Hopefully once we get his breathing under better control, he will be able to exercise more often. His kidney function is elevated so he does have chronic kidney disease which we will need to monitor. His testosterone is low normal.  Please forward his lab results to [facility].

## 2020-10-03 ENCOUNTER — Encounter: Payer: Self-pay | Admitting: Physician Assistant

## 2020-10-13 MED FILL — SILDENAFIL CITRATE 100 MG T: 100 | 30 days supply | Qty: 6 | Fill #2

## 2020-10-14 ENCOUNTER — Other Ambulatory Visit (HOSPITAL_COMMUNITY): Payer: Self-pay | Admitting: Family Medicine

## 2020-10-14 MED FILL — OLMESARTAN-HCTZ 40-12.5 MG: 40-12.5 | 30 days supply | Qty: 30 | Fill #0

## 2020-10-21 ENCOUNTER — Other Ambulatory Visit: Payer: Self-pay | Admitting: Physician Assistant

## 2020-10-21 ENCOUNTER — Ambulatory Visit (INDEPENDENT_AMBULATORY_CARE_PROVIDER_SITE_OTHER): Payer: BC Managed Care – PPO | Admitting: Physician Assistant

## 2020-10-21 ENCOUNTER — Encounter: Payer: Self-pay | Admitting: Physician Assistant

## 2020-10-21 VITALS — BP 118/68 | HR 76 | Ht 71.0 in | Wt 246.0 lb

## 2020-10-21 DIAGNOSIS — K219 Gastro-esophageal reflux disease without esophagitis: Secondary | ICD-10-CM | POA: Diagnosis not present

## 2020-10-21 DIAGNOSIS — Z1212 Encounter for screening for malignant neoplasm of rectum: Secondary | ICD-10-CM

## 2020-10-21 DIAGNOSIS — Z1211 Encounter for screening for malignant neoplasm of colon: Secondary | ICD-10-CM

## 2020-10-21 MED ORDER — NA SULFATE-K SULFATE-MG SULF 17.5-3.13-1.6 GM/177ML PO SOLN: 1.0000 | Freq: Once | ORAL | 0 refills | Status: DC

## 2020-10-21 NOTE — Patient Instructions (Addendum)
If you are age 46 or older, your body mass index should be between 23-30. Your Body mass index is 34.31 kg/m. If this is out of the aforementioned range listed, please consider follow up with your Primary Care Provider.  If you are age 55 or younger, your body mass index should be between 19-25. Your Body mass index is 34.31 kg/m. If this is out of the aformentioned range listed, please consider follow up with your Primary Care Provider.   You have been scheduled for an endoscopy and colonoscopy. Please follow the written instructions given to you at your visit today. Please pick up your prep supplies at the pharmacy within the next 1-3 days. If you use inhalers (even only as needed), please bring them with you on the day of your procedure.  Please continue Omeprazole   Thank you for choosing me and [facility].  Hyacinth Meeker, PA-C

## 2020-10-21 NOTE — Progress Notes (Signed)
Chief Complaint: GERD and screening for colon cancer  HPI:    Richard Hanna is a 45 year old African-American male with a past medical history of COPD, CKD, GERD and others listed below, who was referred to me by Avanell Shackleton, NP-C for a complaint of GERD and screening for colon cancer.      09/24/2020 CBC normal, CMP with a creatinine of 1.61, normal LFTs, normal hemoglobin A1c.    Today, the patient presents to clinic and tells me that he has had reflux for at least the past 2 years, as long as he takes his Omeprazole 40 mg once daily he typically has no issues but if he forgets this pill at all then he feels like food is getting stuck in his throat and if he lays flat that it is worse and he gets a lot of regurgitation of acidic material and foods.  He wants to make sure that nothing else is going on.    Denies fever, chills, weight loss, change in bowel habits or abdominal pain.  Past Medical History:  Diagnosis Date  . Asthma   . CKD (chronic kidney disease) 09/25/2020  . GERD (gastroesophageal reflux disease)   . Headache(784.0)   . Hypertension   . Panic attacks     Past Surgical History:  Procedure Laterality Date  . GANGLION CYST EXCISION     L wrist    Current Outpatient Medications  Medication Sig Dispense Refill  . Budeson-Glycopyrrol-Formoterol (BREZTRI AEROSPHERE) 160-9-4.8 MCG/ACT AERO Inhale into the lungs.    Marland Kitchen olmesartan-hydrochlorothiazide (BENICAR HCT) 40-12.5 MG tablet Take 1 tablet by mouth daily.    Marland Kitchen omeprazole (PRILOSEC) 40 MG capsule Take 1 capsule (40 mg total) by mouth daily. 90 capsule 3  . sildenafil (VIAGRA) 100 MG tablet Take 0.5-1 tablets (50-100 mg total) by mouth daily as needed for erectile dysfunction. 5 tablet 11   No current facility-administered medications for this visit.    Allergies as of 10/21/2020  . (No Known Allergies)    Family History  Problem Relation Age of Onset  . Hypertension Mother   . Hypertension Father   . Diabetes  Father   . Diabetes Paternal Grandfather     Social History   Socioeconomic History  . Marital status: Married    Spouse name: Not on file  . Number of children: Not on file  . Years of education: Not on file  . Highest education level: Not on file  Occupational History  . Not on file  Tobacco Use  . Smoking status: Former Smoker    Packs/day: 1.00    Years: 6.00    Pack years: 6.00    Types: Cigarettes    Quit date: 02/28/2017    Years since quitting: 3.6  . Smokeless tobacco: Never Used  Vaping Use  . Vaping Use: Never used  Substance and Sexual Activity  . Alcohol use: Yes  . Drug use: No  . Sexual activity: Yes  Other Topics Concern  . Not on file  Social History Narrative  . Not on file   Social Determinants of Health   Financial Resource Strain: Not on file  Food Insecurity: Not on file  Transportation Needs: Not on file  Physical Activity: Not on file  Stress: Not on file  Social Connections: Not on file  Intimate Partner Violence: Not on file    Review of Systems:    Constitutional: No weight loss, fever or chills Skin: No rash  Cardiovascular: No chest  pain Respiratory: No SOB  Gastrointestinal: See HPI and otherwise negative Genitourinary: No dysuria  Neurological: No headache, dizziness or syncope Musculoskeletal: No new muscle or joint pain Hematologic: No bleeding Psychiatric: No history of depression or anxiety   Physical Exam:  Vital signs: BP 118/68   Pulse 76   Ht 5\' 11"  (1.803 m)   Wt 246 lb (111.6 kg)   BMI 34.31 kg/m   Constitutional:   Pleasant overweight AA male appears to be in NAD, Well developed, Well nourished, alert and cooperative Head:  Normocephalic and atraumatic. Eyes:   PEERL, EOMI. No icterus. Conjunctiva pink. Ears:  Normal auditory acuity. Neck:  Supple Throat: Oral cavity and pharynx without inflammation, swelling or lesion.  Respiratory: Respirations even and unlabored. Lungs clear to auscultation bilaterally.    No wheezes, crackles, or rhonchi.  Cardiovascular: Normal S1, S2. No MRG. Regular rate and rhythm. No peripheral edema, cyanosis or pallor.  Gastrointestinal:  Soft, nondistended, nontender. No rebound or guarding. Normal bowel sounds. No appreciable masses or hepatomegaly. Rectal:  Not performed.  Msk:  Symmetrical without gross deformities. Without edema, no deformity or joint abnormality.  Neurologic:  Alert and  oriented x4;  grossly normal neurologically.  Skin:   Dry and intact without significant lesions or rashes. Psychiatric:  Demonstrates good judgement and reason without abnormal affect or behaviors.  RELEVANT LABS AND IMAGING: CBC    Component Value Date/Time   WBC 6.6 09/24/2020 0908   WBC 6.4 01/07/2020 0914   RBC 4.66 09/24/2020 0908   RBC 5.46 01/07/2020 0914   HGB 14.2 09/24/2020 0908   HCT 42.0 09/24/2020 0908   PLT 200 09/24/2020 0908   MCV 90 09/24/2020 0908   MCH 30.5 09/24/2020 0908   MCH 30.0 01/07/2020 0914   MCHC 33.8 09/24/2020 0908   MCHC 33.4 01/07/2020 0914   RDW 13.0 09/24/2020 0908   LYMPHSABS 2.3 09/24/2020 0908   MONOABS 0.4 01/07/2020 0914   EOSABS 0.3 09/24/2020 0908   BASOSABS 0.1 09/24/2020 0908    CMP     Component Value Date/Time   NA 141 09/24/2020 0908   K 4.4 09/24/2020 0908   CL 103 09/24/2020 0908   CO2 24 09/24/2020 0908   GLUCOSE 96 09/24/2020 0908   GLUCOSE 106 (H) 01/07/2020 0914   BUN 17 09/24/2020 0908   CREATININE 1.61 (H) 09/24/2020 0908   CREATININE 1.39 (H) 02/07/2019 1448   CALCIUM 9.5 09/24/2020 0908   PROT 7.1 09/24/2020 0908   ALBUMIN 4.6 09/24/2020 0908   AST 22 09/24/2020 0908   ALT 22 09/24/2020 0908   ALKPHOS 44 09/24/2020 0908   BILITOT 0.2 09/24/2020 0908   GFRNONAA 51 (L) 09/24/2020 0908   GFRAA 59 (L) 09/24/2020 0908    Assessment: 1.  GERD: Chronic for 2 years, typically maintained on Omeprazole 40 mg daily but if he misses even 1 dose he has breakthrough symptoms; likely GERD+/-gastritis 2.   Screening for colorectal cancer: Patient is 44 and due for screening  Plan: 1.  Scheduled patient for diagnostic EGD and screening colonoscopy in the [facility] with Dr. 11/22/2020.  Patient requested a Monday.  Provided the patient with a detailed list of risks for the procedure and he agrees to proceed.  He has had both of his COVID vaccines and a booster. 2.  Continue Omeprazole 40 mg daily, 30-60 minutes before breakfast 3.  Reviewed antireflux measures. 4.  Patient to follow in clinic per recommendations from Dr. Sunday after time of procedure.  Hyacinth Meeker, PA-C [facility] 10/21/2020, 8:32 AM  Cc: Henson, Vickie L, NP-C

## 2020-10-21 NOTE — Progress Notes (Signed)
I agree with the above note, plan 

## 2020-11-04 ENCOUNTER — Encounter: Payer: Self-pay | Admitting: Family Medicine

## 2020-11-10 ENCOUNTER — Encounter: Payer: Self-pay | Admitting: Family Medicine

## 2020-11-10 ENCOUNTER — Other Ambulatory Visit: Payer: Self-pay | Admitting: Family Medicine

## 2020-11-10 MED ORDER — OMEPRAZOLE 40 MG PO CPDR: 40.0000 mg | DELAYED_RELEASE_CAPSULE | Freq: Every day | ORAL | 0 refills | Status: DC

## 2020-11-11 ENCOUNTER — Encounter: Payer: Self-pay | Admitting: Family Medicine

## 2020-11-11 MED ORDER — OMEPRAZOLE 40 MG PO CPDR: 40.0000 mg | DELAYED_RELEASE_CAPSULE | Freq: Every day | ORAL | 0 refills | Status: DC

## 2020-11-12 ENCOUNTER — Other Ambulatory Visit (HOSPITAL_BASED_OUTPATIENT_CLINIC_OR_DEPARTMENT_OTHER): Payer: Self-pay

## 2020-11-22 ENCOUNTER — Other Ambulatory Visit: Payer: Self-pay

## 2020-11-24 MED ORDER — OLMESARTAN MEDOXOMIL-HCTZ 40-12.5 MG PO TABS: ORAL_TABLET | ORAL | 0 refills | Status: DC

## 2020-11-30 ENCOUNTER — Encounter: Payer: Self-pay | Admitting: Family Medicine

## 2020-12-05 ENCOUNTER — Other Ambulatory Visit (HOSPITAL_COMMUNITY): Payer: Self-pay

## 2020-12-05 MED FILL — Sod Sulfate-Pot Sulf-Mg Sulf Oral Sol 17.5-3.13-1.6 GM/177ML: ORAL | 1 days supply | Qty: 354 | Fill #0 | Status: CN

## 2020-12-06 ENCOUNTER — Other Ambulatory Visit: Payer: Self-pay | Admitting: Internal Medicine

## 2020-12-06 DIAGNOSIS — Z1211 Encounter for screening for malignant neoplasm of colon: Secondary | ICD-10-CM

## 2020-12-06 MED ORDER — NA SULFATE-K SULFATE-MG SULF 17.5-3.13-1.6 GM/177ML PO SOLN: ORAL | 0 refills | Status: DC

## 2020-12-06 NOTE — Progress Notes (Signed)
Pt called and asked prep be sent to [facility] (rather than [facility]).  I changed the destination pharmacy. JMP

## 2020-12-08 ENCOUNTER — Encounter: Payer: Self-pay | Admitting: Gastroenterology

## 2020-12-08 ENCOUNTER — Other Ambulatory Visit: Payer: Self-pay

## 2020-12-08 ENCOUNTER — Ambulatory Visit (AMBULATORY_SURGERY_CENTER): Payer: BC Managed Care – PPO | Admitting: Gastroenterology

## 2020-12-08 VITALS — BP 98/71 | HR 70 | Temp 90.0°F | Resp 14 | Ht 71.0 in | Wt 246.1 lb

## 2020-12-08 DIAGNOSIS — Z1211 Encounter for screening for malignant neoplasm of colon: Secondary | ICD-10-CM | POA: Diagnosis not present

## 2020-12-08 DIAGNOSIS — Z1212 Encounter for screening for malignant neoplasm of rectum: Secondary | ICD-10-CM

## 2020-12-08 DIAGNOSIS — K219 Gastro-esophageal reflux disease without esophagitis: Secondary | ICD-10-CM

## 2020-12-08 DIAGNOSIS — K2100 Gastro-esophageal reflux disease with esophagitis, without bleeding: Secondary | ICD-10-CM

## 2020-12-08 MED ORDER — SODIUM CHLORIDE 0.9 % IV SOLN: 500.0000 mL | Freq: Once | INTRAVENOUS | Status: DC

## 2020-12-08 NOTE — Patient Instructions (Addendum)
Handouts re: diverticulosis provided  Resume previous diet continue current medications Please take omeprazole 40mg  at least 30 minutes before eating  Repeat colonoscopy in 10 years  YOU HAD AN ENDOSCOPIC PROCEDURE TODAY AT THE [facility]:   Refer to the procedure report that was given to you for any specific questions about what was found during the examination.  If the procedure report does not answer your questions, please call your gastroenterologist to clarify.  If you requested that your care partner not be given the details of your procedure findings, then the procedure report has been included in a sealed envelope for you to review at your convenience later.  YOU SHOULD EXPECT: Some feelings of bloating in the abdomen. Passage of more gas than usual.  Walking can help get rid of the air that was put into your GI tract during the procedure and reduce the bloating. If you had a lower endoscopy (such as a colonoscopy or flexible sigmoidoscopy) you may notice spotting of blood in your stool or on the toilet paper. If you underwent a bowel prep for your procedure, you may not have a normal bowel movement for a few days.  Please Note:  You might notice some irritation and congestion in your nose or some drainage.  This is from the oxygen used during your procedure.  There is no need for concern and it should clear up in a day or so.  SYMPTOMS TO REPORT IMMEDIATELY:   Following lower endoscopy (colonoscopy or flexible sigmoidoscopy):  Excessive amounts of blood in the stool  Significant tenderness or worsening of abdominal pains  Swelling of the abdomen that is new, acute  Fever of 100F or higher   Following upper endoscopy (EGD)  Vomiting of blood or coffee ground material  New chest pain or pain under the shoulder blades  Painful or persistently difficult swallowing  New shortness of breath  Fever of 100F or higher  Black, tarry-looking stools  For urgent or emergent  issues, a gastroenterologist can be reached at any hour by calling [phone]. Do not use MyChart messaging for urgent concerns.   DIET:  We do recommend a small meal at first, but then you may proceed to your regular diet.  Drink plenty of fluids but you should avoid alcoholic beverages for 24 hours.  ACTIVITY:  You should plan to take it easy for the rest of today and you should NOT DRIVE or use heavy machinery until tomorrow (because of the sedation medicines used during the test).    FOLLOW UP: Our staff will call the number listed on your records 48-72 hours following your procedure to check on you and address any questions or concerns that you may have regarding the information given to you following your procedure. If we do not reach you, we will leave a message.  We will attempt to reach you two times.  During this call, we will ask if you have developed any symptoms of COVID 19. If you develop any symptoms (ie: fever, flu-like symptoms, shortness of breath, cough etc.) before then, please call [phone].  If you test positive for Covid 19 in the 2 weeks post procedure, please call and report this information to [phone].    If any biopsies were taken you will be contacted by phone or by letter within the next 1-3 weeks.  Please call us at [phone] if you have not heard about the biopsies in 3 weeks.   SIGNATURES/CONFIDENTIALITY: You and/or your care  partner have signed paperwork which will be entered into your electronic medical record.  These signatures attest to the fact that that the information above on your After Visit Summary has been reviewed and is understood.  Full responsibility of the confidentiality of this discharge information lies with you and/or your care-partner.

## 2020-12-08 NOTE — Progress Notes (Signed)
pt tolerated well. VSS. awake and to recovery. Report given to RN. Bite block left insitu no s/s trauma.

## 2020-12-08 NOTE — Progress Notes (Signed)
VS- Richard Hanna 

## 2020-12-08 NOTE — Op Note (Signed)
[facility] Patient Name: Richard Hanna Procedure Date: 12/08/2020 2:40 PM MRN: [id number] Endoscopist: Rachael Fee , MD Age: 45 Referring MD:  Date of Birth: 03-24-1976 Gender: Male Account #: [id number] Procedure:                Colonoscopy Indications:              Screening for colorectal malignant neoplasm Medicines:                Monitored Anesthesia Care Procedure:                Pre-Anesthesia Assessment:                           - Prior to the procedure, a History and Physical                            was performed, and patient medications and                            allergies were reviewed. The patient's tolerance of                            previous anesthesia was also reviewed. The risks                            and benefits of the procedure and the sedation                            options and risks were discussed with the patient.                            All questions were answered, and informed consent                            was obtained. Prior Anticoagulants: The patient has                            taken no previous anticoagulant or antiplatelet                            agents. ASA Grade Assessment: III - A patient with                            severe systemic disease. After reviewing the risks                            and benefits, the patient was deemed in                            satisfactory condition to undergo the procedure.                           After obtaining informed consent, the colonoscope  was passed under direct vision. Throughout the                            procedure, the patient's blood pressure, pulse, and                            oxygen saturations were monitored continuously. The                            Olympus CF-HQ190 [id number]) Colonoscope was                            introduced through the anus and advanced to the the                            cecum, identified  by appendiceal orifice and                            ileocecal valve. The colonoscopy was performed                            without difficulty. The patient tolerated the                            procedure well. The quality of the bowel                            preparation was good. The ileocecal valve,                            appendiceal orifice, and rectum were photographed. Scope In: 2:54:54 PM Scope Out: 3:07:12 PM Scope Withdrawal Time: 0 hours 10 minutes 22 seconds  Total Procedure Duration: 0 hours 12 minutes 18 seconds  Findings:                 A few small-mouthed diverticula were found in the                            left colon.                           The exam was otherwise without abnormality on                            direct and retroflexion views. Complications:            No immediate complications. Estimated blood loss:                            None. Estimated Blood Loss:     Estimated blood loss: none. Impression:               - Diverticulosis in the left colon.                           - The examination was otherwise normal on direct  and retroflexion views.                           - No polyps or cancers. Recommendation:           - EGD now.                           - Repeat colonoscopy in 10 years for screening. Rachael Fee, MD 12/08/2020 3:09:19 PM This report has been signed electronically.

## 2020-12-08 NOTE — Op Note (Addendum)
[facility] Patient Name: Richard Hanna Procedure Date: 12/08/2020 2:40 PM MRN: [id number] Endoscopist: Rachael Fee , MD Age: 45 Referring MD:  Date of Birth: Jun 09, 1976 Gender: Male Account #: [id number] Procedure:                Upper GI endoscopy Indications:              Heartburn Medicines:                Monitored Anesthesia Care Procedure:                Pre-Anesthesia Assessment:                           - Prior to the procedure, a History and Physical                            was performed, and patient medications and                            allergies were reviewed. The patient's tolerance of                            previous anesthesia was also reviewed. The risks                            and benefits of the procedure and the sedation                            options and risks were discussed with the patient.                            All questions were answered, and informed consent                            was obtained. Prior Anticoagulants: The patient has                            taken no previous anticoagulant or antiplatelet                            agents. ASA Grade Assessment: II - A patient with                            mild systemic disease. After reviewing the risks                            and benefits, the patient was deemed in                            satisfactory condition to undergo the procedure.                           After obtaining informed consent, the endoscope was  passed under direct vision. Throughout the                            procedure, the patient's blood pressure, pulse, and                            oxygen saturations were monitored continuously. The                            Endoscope was introduced through the mouth, and                            advanced to the second part of duodenum. The upper                            GI endoscopy was accomplished without  difficulty.                            The patient tolerated the procedure well. Scope In: Scope Out: Findings:                 LA Grade A (one or more mucosal breaks less than 5                            mm, not extending between tops of 2 mucosal folds)                            esophagitis was found at the gastroesophageal                            junction.                           The exam was otherwise without abnormality. Complications:            No immediate complications. Estimated blood loss:                            None. Estimated Blood Loss:     Estimated blood loss: none. Impression:               - Mild acid reflux related esophagitis.                           - The examination was otherwise normal. Recommendation:           - Patient has a contact number available for                            emergencies. The signs and symptoms of potential                            delayed complications were discussed with the                            patient. Return to normal activities tomorrow.  Written discharge instructions were provided to the                            patient.                           - Resume previous diet.                           - Continue present medications. The optimal timing                            for your antiacid medicine (omeprazole 40mg  pills)                            is to take one pill 20-30 min before your breakfast                            meal. , MD 12/08/2020 3:15:37 PM This report has been signed electronically.

## 2020-12-10 ENCOUNTER — Telehealth: Payer: Self-pay

## 2020-12-10 NOTE — Telephone Encounter (Signed)
  Follow up Call-  Call back number 12/08/2020  Post procedure Call Back phone  # [phone]  Permission to leave phone message Yes  Some recent data might be hidden     Patient questions:  Do you have a fever, pain , or abdominal swelling? No. Pain Score  0 *  Have you tolerated food without any problems? Yes.    Have you been able to return to your normal activities? Yes.    Do you have any questions about your discharge instructions: Diet   No. Medications  No. Follow up visit  No.  Do you have questions or concerns about your Care? No.  Actions: * If pain score is 4 or above: No action needed, pain <4.

## 2020-12-19 ENCOUNTER — Encounter: Payer: Self-pay | Admitting: Family Medicine

## 2020-12-19 ENCOUNTER — Telehealth (INDEPENDENT_AMBULATORY_CARE_PROVIDER_SITE_OTHER): Payer: BC Managed Care – PPO | Admitting: Family Medicine

## 2020-12-19 VITALS — BP 90/54 | HR 75 | Wt 230.0 lb

## 2020-12-19 DIAGNOSIS — I959 Hypotension, unspecified: Secondary | ICD-10-CM | POA: Diagnosis not present

## 2020-12-19 DIAGNOSIS — R42 Dizziness and giddiness: Secondary | ICD-10-CM

## 2020-12-19 NOTE — Progress Notes (Signed)
Subjective:  Documentation for virtual audio and video telecommunications through Caregility encounter:  The patient was located in his car in his driveway. 2 patient identifiers used.  The provider was located in the office. The patient did consent to this visit and is aware of possible charges through their insurance for this visit.  The other persons participating in this telemedicine service were none. Time spent on call was 20 minutes and in review of previous records >20 minutes total.  This virtual service is not related to other E/M service within previous 7 days.   Patient ID: Richard Hanna, male    DOB: [birth year], 45 y.o.   MRN: [id number]  HPI Chief Complaint  Patient presents with  . not feeling well    Not feeling well, bp was 89/50, dizzy, feels like hes going to faint when standing. Pressure in head and eye. Been going on for for over a week. Hasn't taken bp med in 2 days   Complains of dizziness when he bends over or stands up.  States his blood pressure has been low also for the past few days.  States he stopped taking his blood pressure medication 2 days ago due to the low readings. He has not checked his BP today.   States he feels pressure behind his eyes like he is about to get a headache.  He had a colonoscopy on 12/08/2020 and states he was pleased with the weight loss that he had on the prep so he has been trying to avoid gaining that weight back.  States he has still been trying to eat less and has not been consuming many fluids. Water intake- only 3 bottles per day.   His wife is a Engineer, civil (consulting) at [employer].  She can keep an eye on his blood pressure.  Denies fever, chills, dizziness, chest pain, palpitations, shortness of breath, abdominal pain, N/V/D, urinary symptoms, LE edema.   Reviewed allergies, medications, past medical, surgical, family, and social history.    Review of Systems Pertinent positives and negatives in the history of present illness.      Objective:   Physical Exam BP (!) 90/54   Pulse 75   Wt 230 lb (104.3 kg)   BMI 32.08 kg/m   Alert and oriented and in no acute distress.  Respirations unlabored.  Normal speech, mood and thought process.  Facial movements are symmetric.      Assessment & Plan:  Dizziness on standing  Hypotension, unspecified hypotension type  Suspect that his dizziness is multifactorial but mainly due to hypovolemia since his fluid intake sounds to be an adequate.  Recent colonoscopy and continuing his medication are all contributing. He reported trying to keep the weight off that he lost during his colonoscopy so he has not been eating or drinking as much as usual.  Encouraged him to go back to his usual diet. I did not ask him to come to the office for further evaluation due to the fact that he did not have anyone to drive him and he lives 15 minutes from my office.  I was concerned about him driving feeling dizzy.  He will hold his blood pressure medication until Monday at least.  He will avoid taking sildenafil.  Advised him to increase his fluids.  Recommend he increase his fluid intake from 3 bottles of water per day to at least double that and include other fluids such as Gatorade or Powerade. Encouraged him to ask his wife who is a Engineer, civil (consulting)  to check his blood pressure and pulse, orthostatic vitals because I do suspect he is orthostatic.  She will do this later today. I will write him an out of work note for tonight.  He is off until Monday night. He will call and come in to see me Monday if he is not feeling better.  He understands that if he worsens over the weekend that he will need to go to the closest emergency department or potentially call 911.

## 2020-12-24 ENCOUNTER — Other Ambulatory Visit: Payer: Self-pay | Admitting: Family Medicine

## 2020-12-25 ENCOUNTER — Encounter: Payer: BC Managed Care – PPO | Admitting: Family Medicine

## 2020-12-25 ENCOUNTER — Encounter: Payer: Self-pay | Admitting: Family Medicine

## 2021-01-13 DIAGNOSIS — E349 Endocrine disorder, unspecified: Secondary | ICD-10-CM | POA: Diagnosis not present

## 2021-01-13 DIAGNOSIS — N5201 Erectile dysfunction due to arterial insufficiency: Secondary | ICD-10-CM | POA: Diagnosis not present

## 2021-02-02 ENCOUNTER — Encounter: Payer: Self-pay | Admitting: Family Medicine

## 2021-02-02 MED ORDER — OLMESARTAN MEDOXOMIL-HCTZ 40-12.5 MG PO TABS
ORAL_TABLET | ORAL | 0 refills | Status: AC
Start: 2021-02-02 — End: ?

## 2021-02-02 MED ORDER — OMEPRAZOLE 40 MG PO CPDR: DELAYED_RELEASE_CAPSULE | ORAL | 0 refills | Status: AC

## 2021-03-02 ENCOUNTER — Telehealth: Payer: Self-pay | Admitting: Family Medicine

## 2021-03-02 NOTE — Telephone Encounter (Signed)
Dismissal letter in guarantor snapshot  °

## 2021-03-20 ENCOUNTER — Other Ambulatory Visit (HOSPITAL_BASED_OUTPATIENT_CLINIC_OR_DEPARTMENT_OTHER): Payer: Self-pay

## 2021-05-02 ENCOUNTER — Other Ambulatory Visit: Payer: Self-pay | Admitting: Family Medicine

## 2021-06-19 IMAGING — DX DG CHEST 2V
2 series · 2 of 2 positions shown · non-contrast
Comparison: 10/03/2015

CLINICAL DATA: Shortness of breath.

EXAM:
CHEST - 2 VIEW

[chest pa]
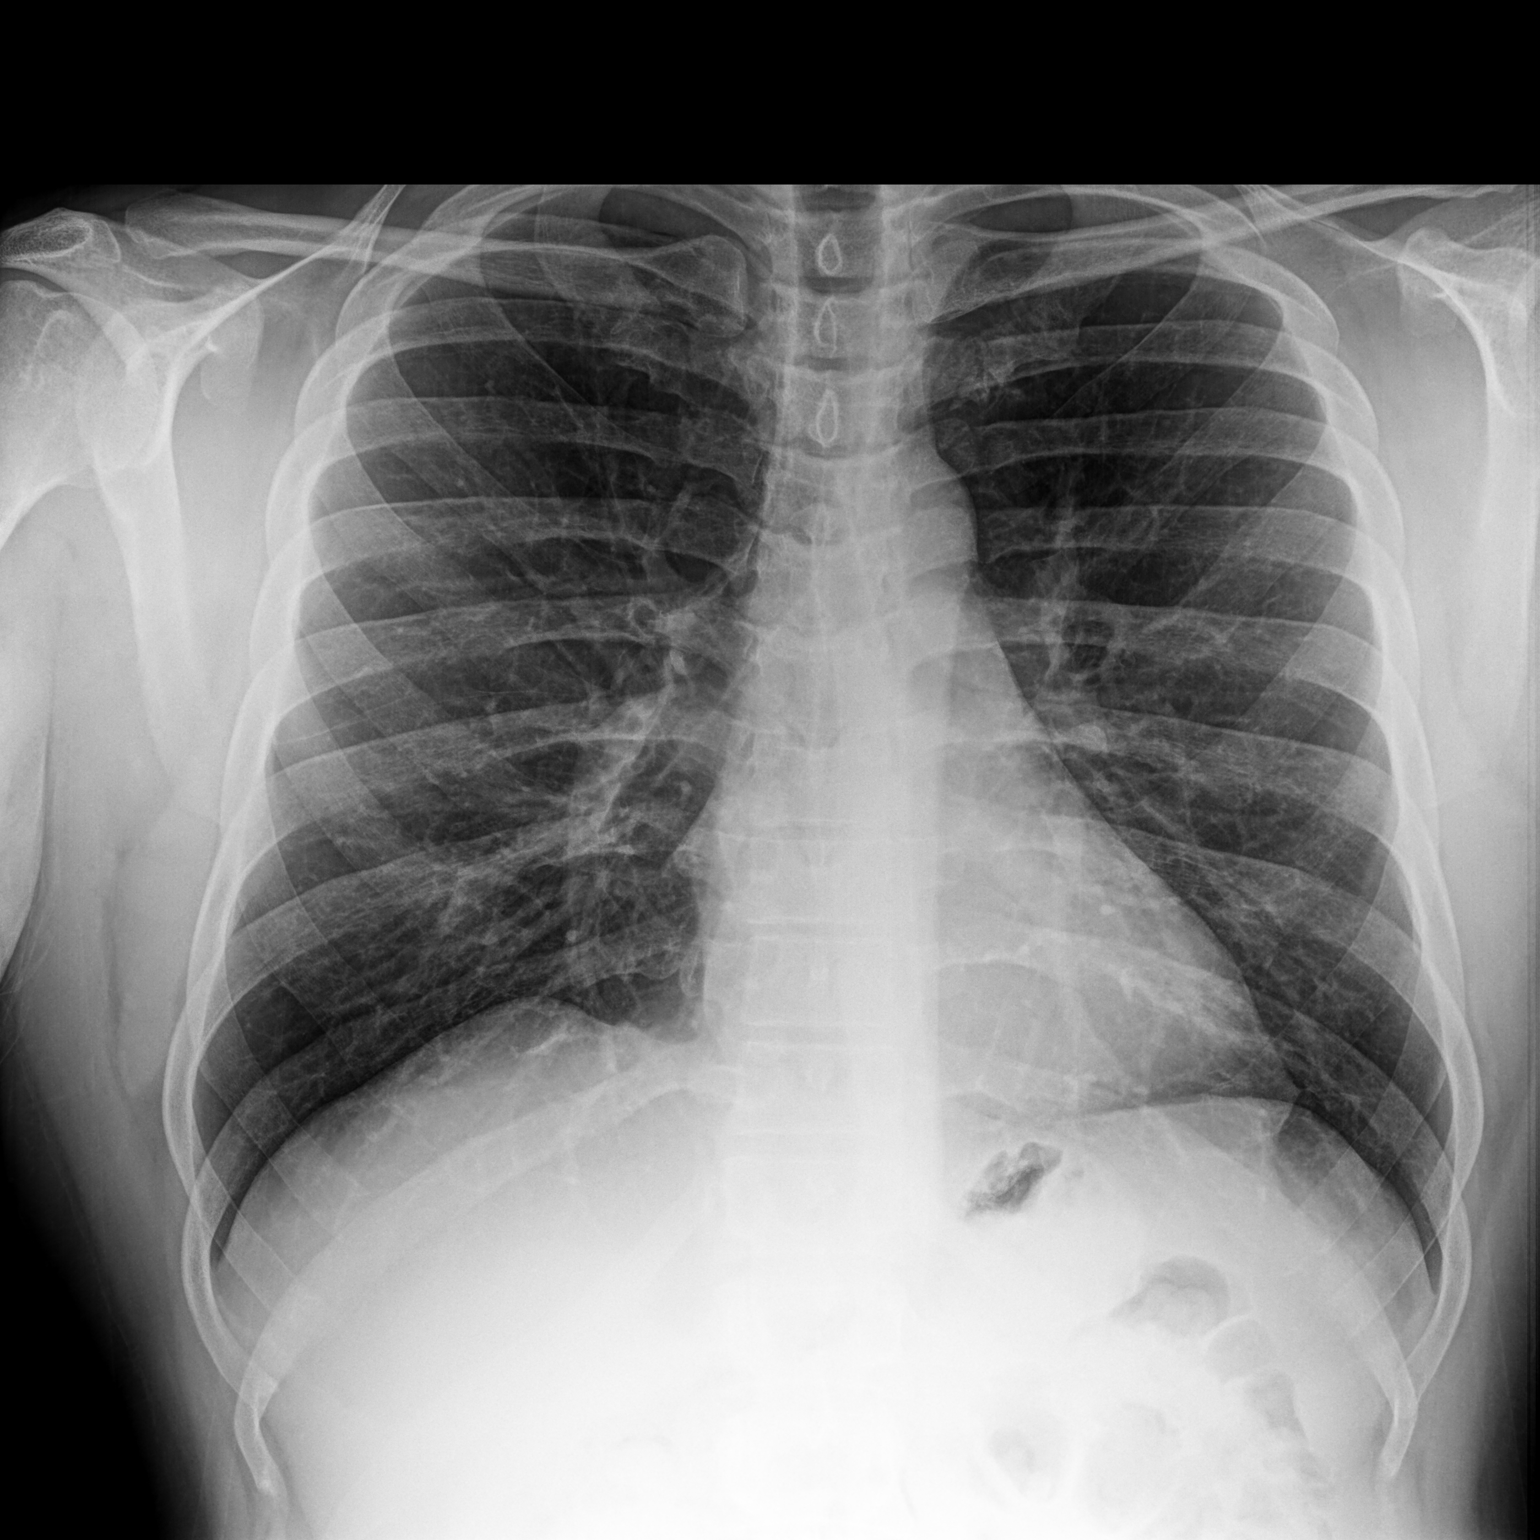

[chest lat]
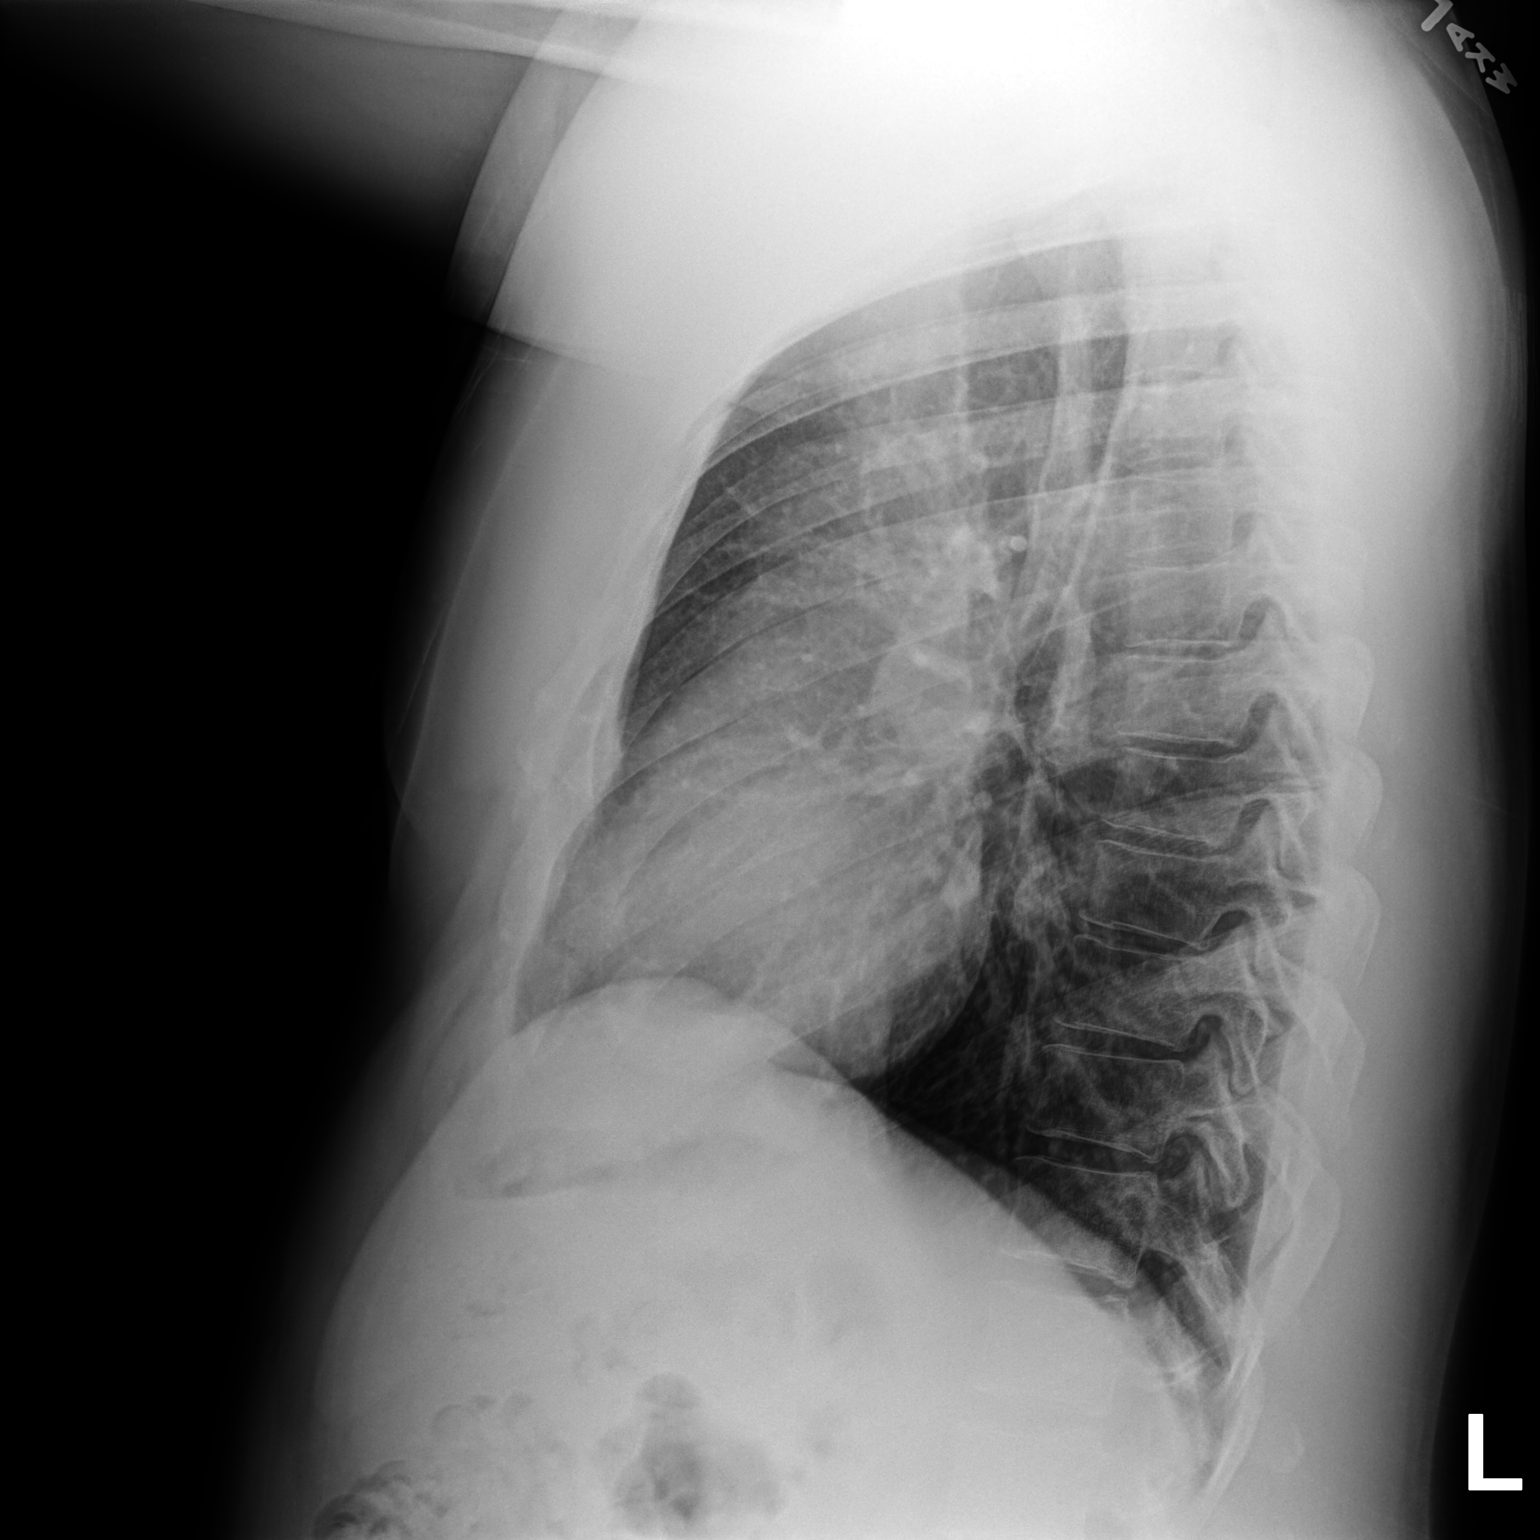

[2 of 2 positions shown; findings below may reference images not displayed]

FINDINGS: The cardiac silhouette, mediastinal and hilar contours are within
normal limits and stable. The lungs are clear. No pleural effusions.
No pulmonary lesions. The bony thorax is intact.
IMPRESSION: No acute cardiopulmonary findings.

## 2021-09-25 IMAGING — CT CT CHEST HIGH RESOLUTION W/O CM
2 of 6 series · 15 of 36 positions shown, 18 images · non-contrast
Comparison: No priors.

CLINICAL DATA: 43-year-old male with increasing shortness of breath
on exertion for several years. Abnormal pulmonary function tests.
History of exercise-induced asthma.

EXAM:
CT CHEST WITHOUT CONTRAST
TECHNIQUE: Multidetector CT imaging of the chest was performed following the
standard protocol without intravenous contrast. High resolution
imaging of the lungs, as well as inspiratory and expiratory imaging,
was performed.

[Series 2: high resolution · axial · 0.75mm/px · z∈[-214,+88]mm · 12 of 169 slices shown, 15 images]
[im 9/169  mediastinal]
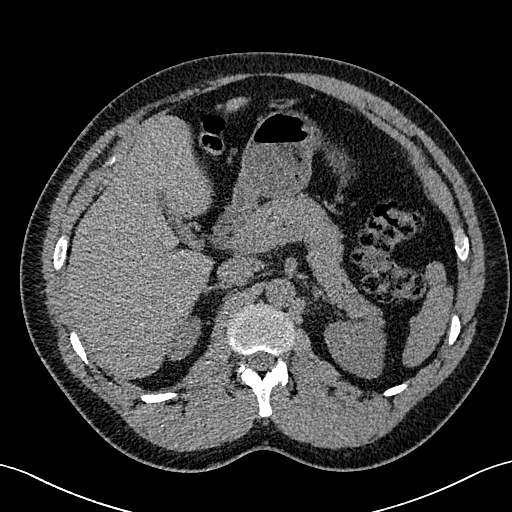
[im 9/169  lung]
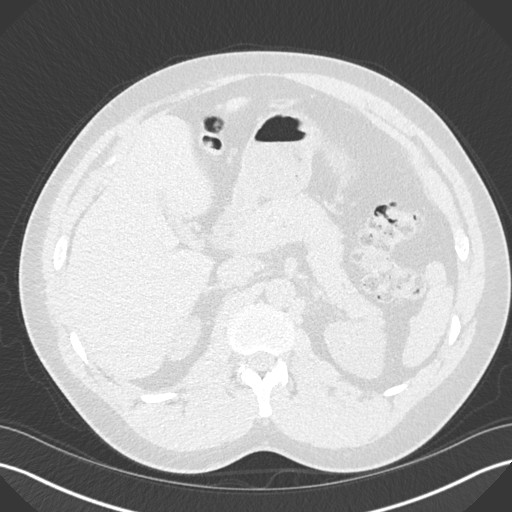
[im 26/169  lung]
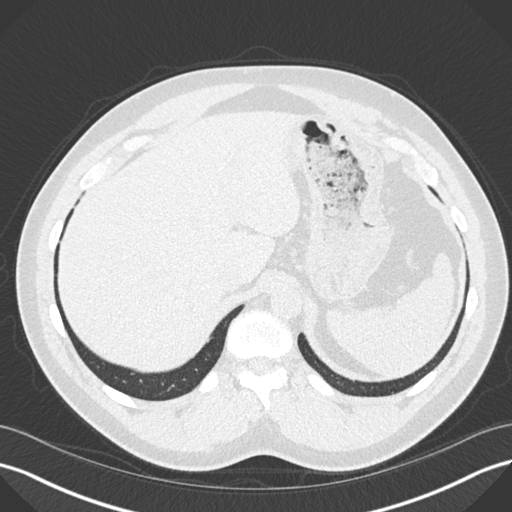
[im 34/169  lung]
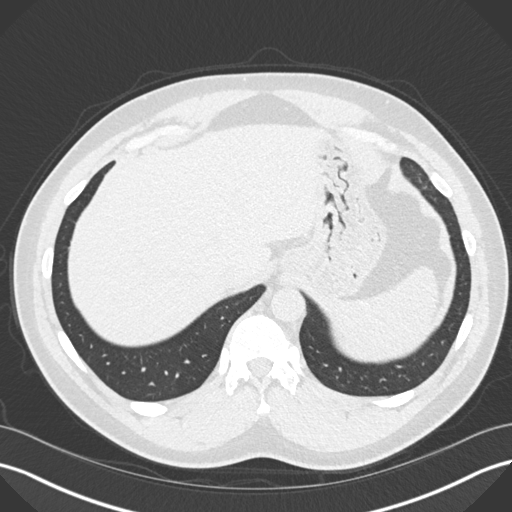
[im 51/169  lung]
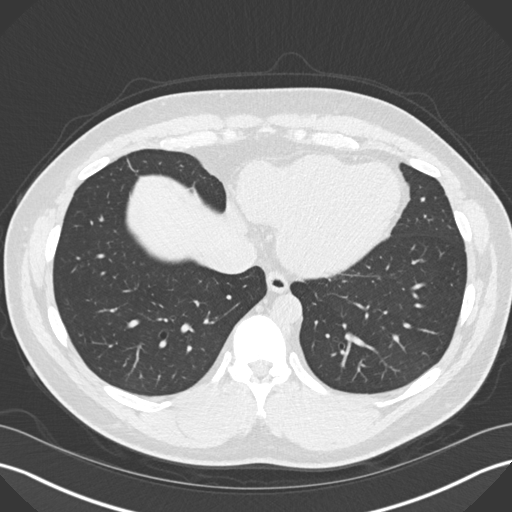
[im 68/169  mediastinal]
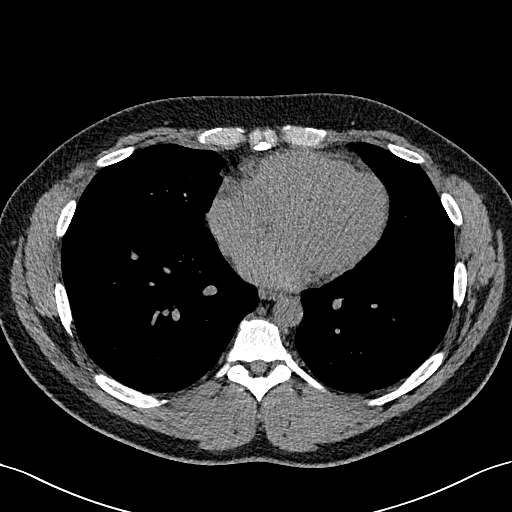
[im 68/169  lung]
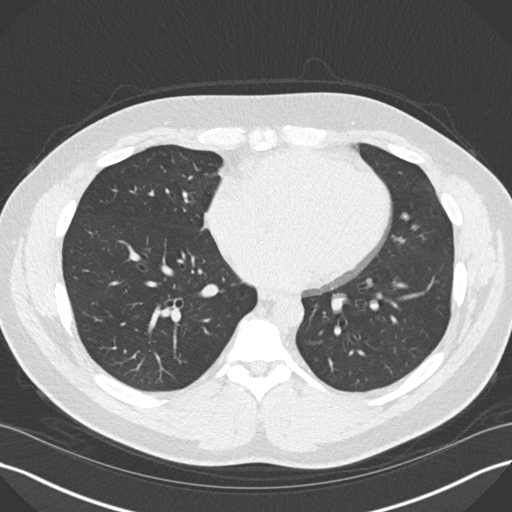
[im 76/169  lung]
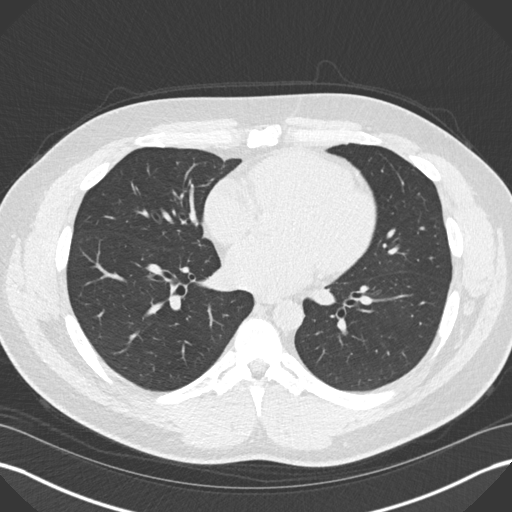
[im 93/169  lung]
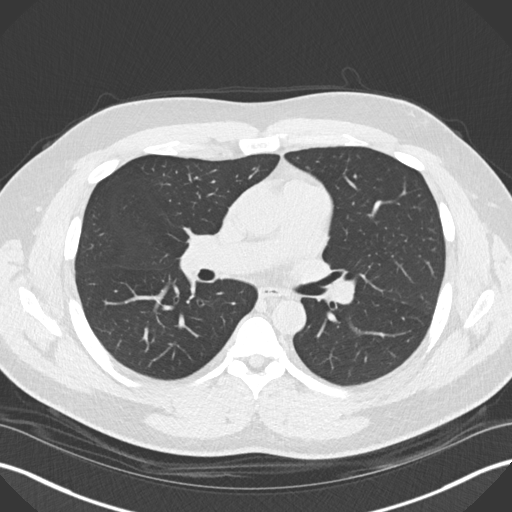
[im 101/169  lung]
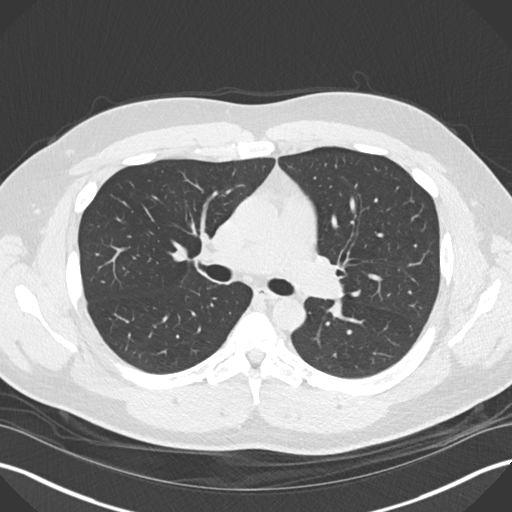
[im 118/169  mediastinal]
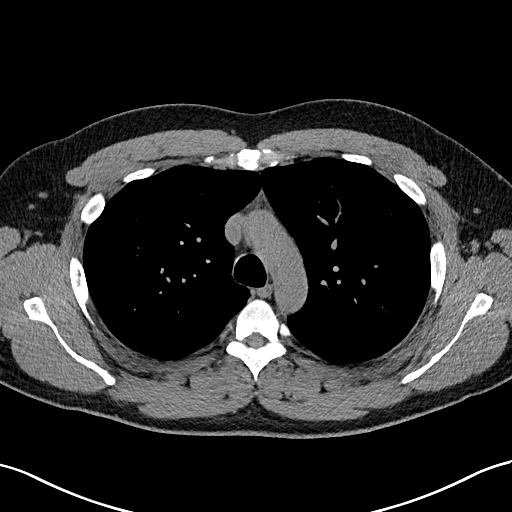
[im 118/169  lung]
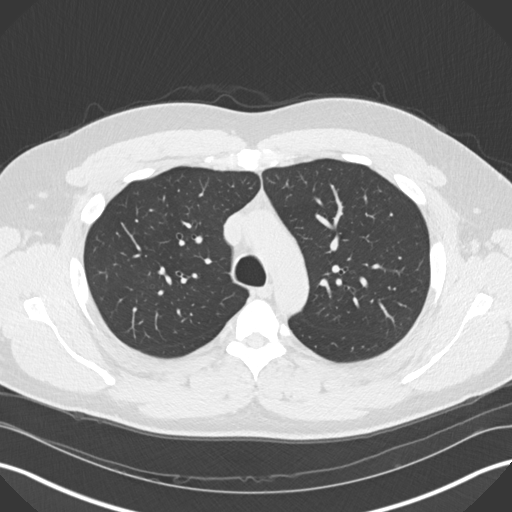
[im 135/169  lung]
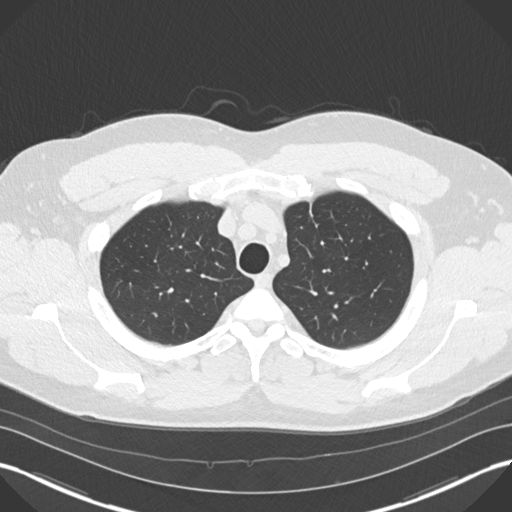
[im 143/169  lung]
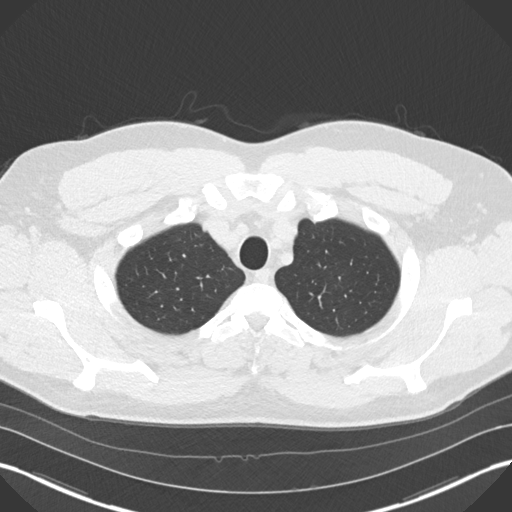
[im 160/169  lung]
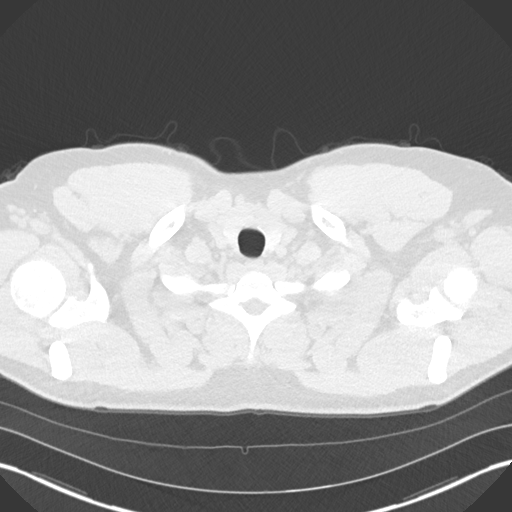

[Series 7: coronal · coronal · 0.69mm/px · 3 of 122 slices shown]
[im 25/122  lung]
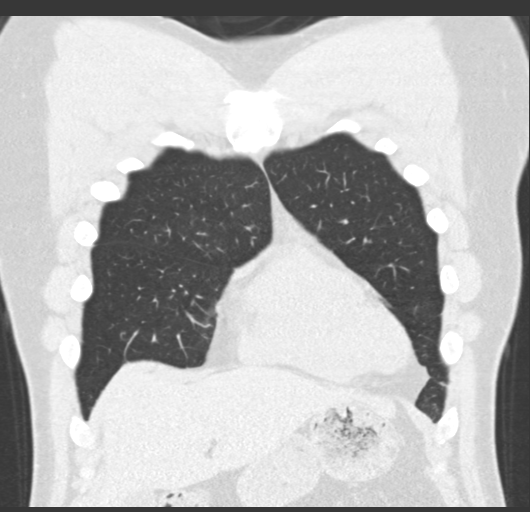
[im 49/122  lung]
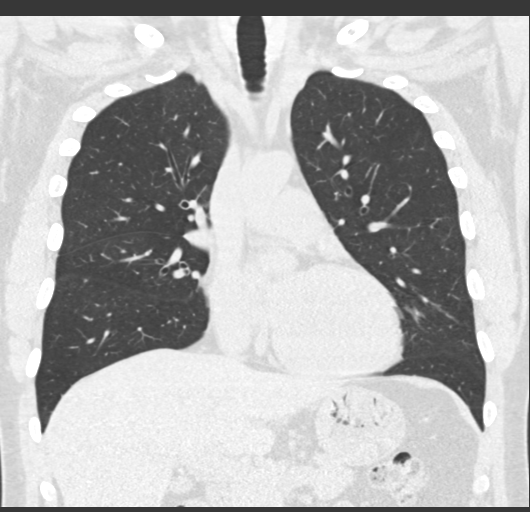
[im 73/122  lung]
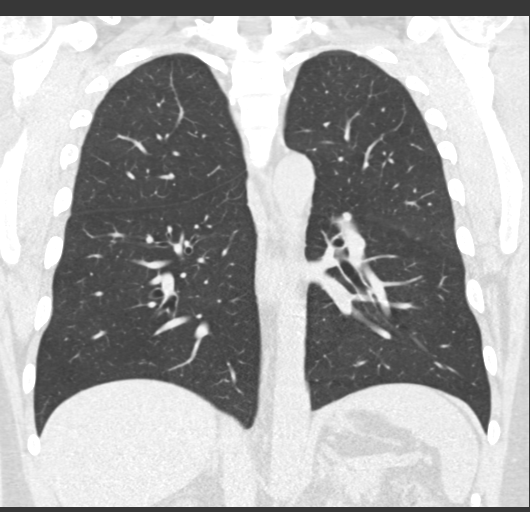

[15 of 36 positions shown; findings below may reference images not displayed]

FINDINGS: Cardiovascular: Heart size is normal. There is no significant
pericardial fluid, thickening or pericardial calcification. No
atherosclerotic calcifications in the thoracic aorta or the coronary
arteries.

Mediastinum/Nodes: No pathologically enlarged mediastinal or hilar
lymph nodes. Please note that accurate exclusion of hilar adenopathy
is limited on noncontrast CT scans. Esophagus is unremarkable in
appearance. No axillary lymphadenopathy.

Lungs/Pleura: High-resolution images demonstrate no significant
regions of ground-glass attenuation, septal thickening, subpleural
reticulation, parenchymal banding, traction bronchiectasis or
honeycombing. Inspiratory and expiratory imaging demonstrates very
mild air trapping indicative of mild small airways disease. No acute
consolidative airspace disease. No pleural effusions. No suspicious
appearing pulmonary nodules or masses are noted.

Upper Abdomen: Unremarkable.

Musculoskeletal: There are no aggressive appearing lytic or blastic
lesions noted in the visualized portions of the skeleton.
IMPRESSION: 1. No evidence of interstitial lung disease.
2. Mild air trapping indicative of mild small airways disease.

## 2021-10-08 ENCOUNTER — Other Ambulatory Visit (HOSPITAL_COMMUNITY): Payer: Self-pay

## 2022-01-05 ENCOUNTER — Other Ambulatory Visit: Payer: Self-pay

## 2022-08-01 ENCOUNTER — Other Ambulatory Visit: Payer: Self-pay

## 2022-08-01 ENCOUNTER — Emergency Department (HOSPITAL_BASED_OUTPATIENT_CLINIC_OR_DEPARTMENT_OTHER)
Admission: EM | Admit: 2022-08-01 | Discharge: 2022-08-01 | Disposition: A | Discharge status: Home / Self Care | Payer: BC Managed Care – PPO | Attending: Emergency Medicine | Admitting: Emergency Medicine

## 2022-08-01 ENCOUNTER — Encounter (HOSPITAL_BASED_OUTPATIENT_CLINIC_OR_DEPARTMENT_OTHER): Payer: Self-pay | Admitting: Emergency Medicine

## 2022-08-01 ENCOUNTER — Emergency Department (HOSPITAL_BASED_OUTPATIENT_CLINIC_OR_DEPARTMENT_OTHER): Payer: BC Managed Care – PPO

## 2022-08-01 DIAGNOSIS — Z1152 Encounter for screening for COVID-19: Secondary | ICD-10-CM | POA: Insufficient documentation

## 2022-08-01 DIAGNOSIS — J40 Bronchitis, not specified as acute or chronic: Secondary | ICD-10-CM

## 2022-08-01 DIAGNOSIS — J069 Acute upper respiratory infection, unspecified: Secondary | ICD-10-CM | POA: Insufficient documentation

## 2022-08-01 LAB — RESP PANEL BY RT-PCR (RSV, FLU A&B, COVID)  RVPGX2
Influenza A by PCR: NEGATIVE
Influenza B by PCR: NEGATIVE
Resp Syncytial Virus by PCR: NEGATIVE
SARS Coronavirus 2 by RT PCR: NEGATIVE

## 2022-08-01 MED ORDER — PREDNISONE 50 MG PO TABS
60.0000 mg | ORAL_TABLET | Freq: Once | ORAL | Status: AC
  Administered 2022-08-01: 60 mg via ORAL
  Filled 2022-08-01: qty 1

## 2022-08-01 MED ORDER — PREDNISONE 10 MG PO TABS: 20.0000 mg | ORAL_TABLET | Freq: Every day | ORAL | 0 refills | Status: AC

## 2022-08-01 MED ORDER — ALBUTEROL SULFATE HFA 108 (90 BASE) MCG/ACT IN AERS
2.0000 | INHALATION_SPRAY | Freq: Once | RESPIRATORY_TRACT | Status: AC
  Administered 2022-08-01: 2 via RESPIRATORY_TRACT
  Filled 2022-08-01: qty 6.7

## 2022-08-01 MED ORDER — AZITHROMYCIN 250 MG PO TABS: 250.0000 mg | ORAL_TABLET | Freq: Every day | ORAL | 0 refills | Status: AC

## 2022-08-01 NOTE — ED Triage Notes (Signed)
Reports cough , congestion x 1 week .

## 2022-08-01 NOTE — Discharge Instructions (Signed)
Chest x-ray shows no evidence of pneumonia.  Overall suspect that you have some inflammation that is resolving from a recent viral process.  I have prescribed you steroids and antibiotic to help with this.  Please use 2 puffs of inhaler as needed for cough as well.

## 2022-08-01 NOTE — ED Provider Notes (Signed)
[facility] Provider Note   CSN: [id number] Arrival date & time: 08/01/22  0749     History  Chief Complaint  Patient presents with   Cough    Richard Hanna is a 46 y.o. male.  Patient here with cough for the last several days.  Nasal congestion, maybe some sputum production.  History of asthma, acid reflux.  Denies any chest pain or major shortness of breath.  Has had some intermittent fever and chills.  Cough still causes some issues especially at night.  Symptoms ongoing for maybe about 1 week.  Denies any weakness, numbness, nausea, vomiting, diarrhea.  The history is provided by the patient.       Home Medications Prior to Admission medications   Medication Sig Start Date End Date Taking? Authorizing Provider  azithromycin (ZITHROMAX) 250 MG tablet Take 1 tablet (250 mg total) by mouth daily. Take first 2 tablets together, then 1 every day until finished. 08/01/22  Yes Zadia Uhde, DO  predniSONE (DELTASONE) 10 MG tablet Take 2 tablets (20 mg total) by mouth daily for 3 days. 08/01/22 08/04/22 Yes Kelty Szafran, DO  Budeson-Glycopyrrol-Formoterol (BREZTRI AEROSPHERE) 160-9-4.8 MCG/ACT AERO Inhale into the lungs.    [provider]  olmesartan-hydrochlorothiazide (BENICAR HCT) 40-12.5 MG tablet TAKE 1 TABLET BY MOUTH DAILY *NEEDS APPT 02/02/21   Raenette Rover, Vickie L, NP-C  omeprazole (PRILOSEC) 40 MG capsule TAKE 1 CAPSULE BY MOUTH ONCE DAILY- PT NEEDS TO MAKE APPOINTMENT 02/02/21 02/02/22  Henson, Vickie L, NP-C  sildenafil (VIAGRA) 100 MG tablet Take 0.5-1 tablets (50-100 mg total) by mouth daily as needed for erectile dysfunction. 08/31/18   Midge Minium, MD      Allergies    Patient has no known allergies.    Review of Systems   Review of Systems  Physical Exam Updated Vital Signs BP (!) 145/111 (BP Location: Right Arm)   Pulse 63   Temp 98.5 F (36.9 C) (Oral)   Resp 17   Ht 5\' 10"  (1.778 m)   Wt 106.6 kg   SpO2  98%   BMI 33.72 kg/m  Physical Exam Vitals and nursing note reviewed.  Constitutional:      General: He is not in acute distress.    Appearance: He is well-developed. He is not ill-appearing.  HENT:     Head: Normocephalic and atraumatic.     Nose: Congestion present.     Mouth/Throat:     Mouth: Mucous membranes are moist.  Eyes:     Extraocular Movements: Extraocular movements intact.     Conjunctiva/sclera: Conjunctivae normal.     Pupils: Pupils are equal, round, and reactive to light.  Cardiovascular:     Rate and Rhythm: Normal rate and regular rhythm.     Pulses: Normal pulses.     Heart sounds: Normal heart sounds. No murmur heard. Pulmonary:     Effort: Pulmonary effort is normal. No respiratory distress.     Breath sounds: Normal breath sounds.  Abdominal:     Palpations: Abdomen is soft.     Tenderness: There is no abdominal tenderness.  Musculoskeletal:        General: No swelling.     Cervical back: Neck supple.  Skin:    General: Skin is warm and dry.     Capillary Refill: Capillary refill takes less than 2 seconds.  Neurological:     Mental Status: He is alert.  Psychiatric:        Mood and  Affect: Mood normal.     ED Results / Procedures / Treatments   Labs (all labs ordered are listed, but only abnormal results are displayed) Labs Reviewed  RESP PANEL BY RT-PCR (RSV, FLU A&B, COVID)  [id number]    EKG None  Radiology DG Chest Portable 1 View  Result Date: 08/01/2022 CLINICAL DATA:  Cough with congestion for 1 week EXAM: PORTABLE CHEST 1 VIEW COMPARISON:  05/16/2019 FINDINGS: Normal heart size and mediastinal contours. No acute infiltrate or edema. No effusion or pneumothorax. No acute osseous findings. IMPRESSION: Negative portable chest. Electronically Signed   By: Tiburcio Pea M.D.   On: 08/01/2022 08:13    Procedures Procedures    Medications Ordered in ED Medications  albuterol (VENTOLIN HFA) 108 (90 Base) MCG/ACT inhaler 2 puff (2  puffs Inhalation Given 08/01/22 0810)  predniSONE (DELTASONE) tablet 60 mg (60 mg Oral Given 08/01/22 1610)    ED Course/ Medical Decision Making/ A&P                           Medical Decision Making Amount and/or Complexity of Data Reviewed Radiology: ordered.  Risk Prescription drug management.   Richard Hanna is here with URI symptoms.  Normal vitals.  No fever.  Well-appearing.  History of asthma but otherwise no significant medical problems.  Differential diagnosis likely resolving viral process versus possible pneumonia versus reactive airway process.  Will get COVID and flu test.  Will get chest x-ray.  Will give albuterol and prednisone.  Suspicion for bronchitis.  Chest x-ray per my review and interpretation shows no evidence of pneumonia or pneumothorax.  Will treat with prednisone and Z-Pak for suspected bronchitis.  Discharged in good condition.  This chart was dictated using voice recognition software.  Despite best efforts to proofread,  errors can occur which can change the documentation meaning.         Final Clinical Impression(s) / ED Diagnoses Final diagnoses:  Viral URI with cough  Bronchitis    Rx / DC Orders ED Discharge Orders          Ordered    predniSONE (DELTASONE) 10 MG tablet  Daily        08/01/22 0820    azithromycin (ZITHROMAX) 250 MG tablet  Daily        08/01/22 0820              Virgina Norfolk, DO 08/01/22 [phone]

## 2024-05-21 ENCOUNTER — Other Ambulatory Visit: Payer: Self-pay

## 2024-07-07 ENCOUNTER — Other Ambulatory Visit: Payer: Self-pay

## 2024-07-07 ENCOUNTER — Emergency Department (HOSPITAL_COMMUNITY)
Admission: EM | Admit: 2024-07-07 | Discharge: 2024-07-08 | Disposition: A | Payer: Self-pay | Attending: Emergency Medicine | Admitting: Emergency Medicine

## 2024-07-07 ENCOUNTER — Encounter (HOSPITAL_COMMUNITY): Payer: Self-pay

## 2024-07-07 ENCOUNTER — Emergency Department (HOSPITAL_COMMUNITY): Payer: Self-pay

## 2024-07-07 DIAGNOSIS — N289 Disorder of kidney and ureter, unspecified: Secondary | ICD-10-CM | POA: Insufficient documentation

## 2024-07-07 DIAGNOSIS — Z79899 Other long term (current) drug therapy: Secondary | ICD-10-CM | POA: Insufficient documentation

## 2024-07-07 DIAGNOSIS — I10 Essential (primary) hypertension: Secondary | ICD-10-CM | POA: Insufficient documentation

## 2024-07-07 DIAGNOSIS — R519 Headache, unspecified: Secondary | ICD-10-CM | POA: Insufficient documentation

## 2024-07-07 DIAGNOSIS — R42 Dizziness and giddiness: Secondary | ICD-10-CM | POA: Insufficient documentation

## 2024-07-07 LAB — DIFFERENTIAL
Abs Immature Granulocytes: 0.02 K/uL (ref 0.00–0.07)
Basophils Absolute: 0 K/uL (ref 0.0–0.1)
Basophils Relative: 1 %
Eosinophils Absolute: 0.3 K/uL (ref 0.0–0.5)
Eosinophils Relative: 4 %
Immature Granulocytes: 0 %
Lymphocytes Relative: 31 %
Lymphs Abs: 2 K/uL (ref 0.7–4.0)
Monocytes Absolute: 0.4 K/uL (ref 0.1–1.0)
Monocytes Relative: 7 %
Neutro Abs: 3.6 K/uL (ref 1.7–7.7)
Neutrophils Relative %: 57 %

## 2024-07-07 LAB — CBC
HCT: 45.2 % (ref 39.0–52.0)
Hemoglobin: 15.3 g/dL (ref 13.0–17.0)
MCH: 30.1 pg (ref 26.0–34.0)
MCHC: 33.8 g/dL (ref 30.0–36.0)
MCV: 89 fL (ref 80.0–100.0)
Platelets: 215 K/uL (ref 150–400)
RBC: 5.08 MIL/uL (ref 4.22–5.81)
RDW: 12.8 % (ref 11.5–15.5)
WBC: 6.3 K/uL (ref 4.0–10.5)
nRBC: 0 % (ref 0.0–0.2)

## 2024-07-07 LAB — I-STAT CHEM 8, ED
BUN: 18 mg/dL (ref 6–20)
Calcium, Ion: 1.14 mmol/L — ABNORMAL LOW (ref 1.15–1.40)
Chloride: 103 mmol/L (ref 98–111)
Creatinine, Ser: 1.8 mg/dL — ABNORMAL HIGH (ref 0.61–1.24)
Glucose, Bld: 96 mg/dL (ref 70–99)
HCT: 45 % (ref 39.0–52.0)
Hemoglobin: 15.3 g/dL (ref 13.0–17.0)
Potassium: 4 mmol/L (ref 3.5–5.1)
Sodium: 136 mmol/L (ref 135–145)
TCO2: 23 mmol/L (ref 22–32)

## 2024-07-07 LAB — COMPREHENSIVE METABOLIC PANEL WITH GFR
ALT: 25 U/L (ref 0–44)
AST: 33 U/L (ref 15–41)
Albumin: 4.7 g/dL (ref 3.5–5.0)
Alkaline Phosphatase: 49 U/L (ref 38–126)
Anion gap: 12 (ref 5–15)
BUN: 16 mg/dL (ref 6–20)
CO2: 23 mmol/L (ref 22–32)
Calcium: 9.6 mg/dL (ref 8.9–10.3)
Chloride: 101 mmol/L (ref 98–111)
Creatinine, Ser: 1.67 mg/dL — ABNORMAL HIGH (ref 0.61–1.24)
GFR, Estimated: 50 mL/min — ABNORMAL LOW (ref >60)
Glucose, Bld: 98 mg/dL (ref 70–99)
Potassium: 4.3 mmol/L (ref 3.5–5.1)
Sodium: 136 mmol/L (ref 135–145)
Total Bilirubin: 0.4 mg/dL (ref 0.0–1.2)
Total Protein: 7.7 g/dL (ref 6.5–8.1)

## 2024-07-07 LAB — APTT: aPTT: 28 s (ref 24–36)

## 2024-07-07 LAB — PROTIME-INR
INR: 1 (ref 0.8–1.2)
Prothrombin Time: 13.3 s (ref 11.4–15.2)

## 2024-07-07 LAB — CBG MONITORING, ED: Glucose-Capillary: 107 mg/dL — ABNORMAL HIGH (ref 70–99)

## 2024-07-07 LAB — ETHANOL: Alcohol, Ethyl (B): <15 mg/dL (ref <15)

## 2024-07-07 MED ORDER — SODIUM CHLORIDE 0.9% FLUSH: 3.0000 mL | Freq: Once | INTRAVENOUS | Status: DC

## 2024-07-07 NOTE — ED Provider Notes (Signed)
 [place] [facility] AT [facility] Provider Note   CSN: [id number] Arrival date & time: 07/07/24  2046     Patient presents with: Dizziness   Richard Hanna is a 48 y.o. male.  {Add pertinent medical, surgical, social history, OB history to YEP:67052} The history is provided by the patient.  Dizziness Richard Hanna is a 48 y.o. male who presents to the Emergency Department complaining of *** Episodes of dizziness with blurred vision.  Followed by high blood pressure.  Episodes 1 month.  15-30 min. One side vision blurry gets headache on right side. One half of right eye is blurry. No eye pain. Feels weak and will lose balance. Thinks he might stumble.  Feels like he might pass out.  Sometimes gets a headache.  Gets upper back pain with deep breath starting today. No chest pain. Feels difficult to breathe.  No fever. No vomiting. No visual correction Hx/o HTN.  148/125 at home 185/100     Prior to Admission medications   Medication Sig Start Date End Date Taking? Authorizing Provider  azithromycin  (ZITHROMAX ) 250 MG tablet Take 1 tablet (250 mg total) by mouth daily. Take first 2 tablets together, then 1 every day until finished. 08/01/22   Curatolo, Adam, DO  Budeson-Glycopyrrol-Formoterol  (BREZTRI  AEROSPHERE) 160-9-4.8 MCG/ACT AERO Inhale into the lungs.    [provider]  olmesartan -hydrochlorothiazide (BENICAR  HCT) 40-12.5 MG tablet TAKE 1 TABLET BY MOUTH DAILY *NEEDS APPT 02/02/21   Lendia, Vickie L, NP-C  omeprazole  (PRILOSEC) 40 MG capsule TAKE 1 CAPSULE BY MOUTH ONCE DAILY- PT NEEDS TO MAKE APPOINTMENT 02/02/21 02/02/22  Henson, Vickie L, NP-C  sildenafil  (VIAGRA ) 100 MG tablet Take 0.5-1 tablets (50-100 mg total) by mouth daily as needed for erectile dysfunction. 08/31/18   Tabori, Katherine E, MD    Allergies: Patient has no known allergies.    Review of Systems  Neurological:  Positive for dizziness.  All other systems reviewed and are  negative.   Updated Vital Signs BP (!) 148/91   Pulse 78   Temp 98.4 F (36.9 C) (Oral)   Resp 17   Ht 5' 11 (1.803 m)   Wt 108.9 kg   SpO2 98%   BMI 33.47 kg/m   Physical Exam Vitals and nursing note reviewed.  Constitutional:      Appearance: He is well-developed.  HENT:     Head: Normocephalic and atraumatic.  Cardiovascular:     Rate and Rhythm: Normal rate and regular rhythm.     Heart sounds: No murmur heard. Pulmonary:     Effort: Pulmonary effort is normal. No respiratory distress.     Breath sounds: Normal breath sounds.  Abdominal:     Palpations: Abdomen is soft.     Tenderness: There is no abdominal tenderness. There is no guarding or rebound.  Musculoskeletal:        General: No tenderness.  Skin:    General: Skin is warm and dry.  Neurological:     Mental Status: He is alert and oriented to person, place, and time.  Psychiatric:        Behavior: Behavior normal.     (all labs ordered are listed, but only abnormal results are displayed) Labs Reviewed  COMPREHENSIVE METABOLIC PANEL WITH GFR - Abnormal; Notable for the following components:      Result Value   Creatinine, Ser 1.67 (*)    GFR, Estimated 50 (*)    All other components within normal limits  I-STAT  CHEM 8, ED - Abnormal; Notable for the following components:   Creatinine, Ser 1.80 (*)    Calcium, Ion 1.14 (*)    All other components within normal limits  CBG MONITORING, ED - Abnormal; Notable for the following components:   Glucose-Capillary 107 (*)    All other components within normal limits  PROTIME-INR  APTT  CBC  DIFFERENTIAL  ETHANOL    EKG: EKG Interpretation Date/Time:  Saturday July 07 2024 21:02:17 EST Ventricular Rate:  72 PR Interval:  165 QRS Duration:  90 QT Interval:  379 QTC Calculation: 415 R Axis:   4  Text Interpretation: Sinus tachycardia Ventricular tachycardia, unsustained Baseline wander in lead(s) V1 V5 Poor data quality Confirmed by Griselda Norris [phone]) on 07/07/2024 11:16:11 PM  Radiology: CT HEAD WO CONTRAST Result Date: 07/07/2024 EXAM: CT HEAD WITHOUT CONTRAST 07/07/2024 10:53:52 PM TECHNIQUE: CT of the head was performed without the administration of intravenous contrast. Automated exposure control, iterative reconstruction, and/or weight based adjustment of the mA/kV was utilized to reduce the radiation dose to as low as reasonably achievable. COMPARISON: CT head 03/25/2004. CLINICAL HISTORY: Neuro deficit, acute, stroke suspected. FINDINGS: BRAIN AND VENTRICLES: No acute hemorrhage. No evidence of acute infarct. No hydrocephalus. No extra-axial collection. No mass effect or midline shift. ORBITS: No acute abnormality. SINUSES: No acute abnormality. SOFT TISSUES AND SKULL: No acute soft tissue abnormality. No skull fracture. IMPRESSION: 1. No acute intracranial abnormality. Electronically signed by: Morgane Naveau MD 07/07/2024 11:00 PM EST RP Workstation: [id number]    {Document cardiac monitor, telemetry assessment procedure when appropriate:32947} Procedures   Medications Ordered in the ED - No data to display    {Click here for ABCD2, HEART and other calculators REFRESH Note before signing:1}                              Medical Decision Making Amount and/or Complexity of Data Reviewed Labs: ordered. Radiology: ordered.   ***  {Document critical care time when appropriate  Document review of labs and clinical decision tools ie CHADS2VASC2, etc  Document your independent review of radiology images and any outside records  Document your discussion with family members, caretakers and with consultants  Document social determinants of health affecting pt's care  Document your decision making why or why not admission, treatments were needed:32947:::1}   Final diagnoses:  None    ED Discharge Orders     None

## 2024-07-07 NOTE — ED Triage Notes (Addendum)
 Pt reports sudden onset of headache,  dizziness and blurry vision in his right eye. This has been going on for a few weeks. It happens sporadically. Tonight's episode started around 1945. He reports feeling weak all over. Denies unilateral weakness. He is speaking clear complete sentences. He has natural right eye droop, per wife.   Wife is also concerned about his BP. She took it tonight and it was 148/125 with his home meter.  He also reports all over body cramps, worse in his back right now.

## 2024-07-08 ENCOUNTER — Emergency Department (HOSPITAL_COMMUNITY): Payer: Self-pay

## 2024-07-08 LAB — D-DIMER, QUANTITATIVE: D-Dimer, Quant: <0.27 ug{FEU}/mL (ref 0.00–0.50)

## 2024-07-08 LAB — TROPONIN T, HIGH SENSITIVITY: Troponin T High Sensitivity: <15 ng/L (ref 0–19)

## 2024-07-08 MED ORDER — AMLODIPINE BESYLATE 2.5 MG PO TABS: 5.0000 mg | ORAL_TABLET | Freq: Every day | ORAL | 1 refills | Status: AC

## 2024-07-08 MED ORDER — IOHEXOL 350 MG/ML SOLN
75.0000 mL | Freq: Once | INTRAVENOUS | Status: AC | PRN
  Administered 2024-07-08: 75 mL via INTRAVENOUS
